# Patient Record
Sex: Female | Born: 1950 | Race: White | Hispanic: No | Marital: Married | Smoking: Former smoker
Health system: Southern US, Community
[De-identification: ages and names within clinical notes are randomized; demographics above are authoritative.]

## PROBLEM LIST (undated history)

## (undated) DIAGNOSIS — I10 Essential (primary) hypertension: Secondary | ICD-10-CM

## (undated) DIAGNOSIS — M797 Fibromyalgia: Secondary | ICD-10-CM

## (undated) DIAGNOSIS — F329 Major depressive disorder, single episode, unspecified: Secondary | ICD-10-CM

## (undated) DIAGNOSIS — M1990 Unspecified osteoarthritis, unspecified site: Secondary | ICD-10-CM

## (undated) DIAGNOSIS — F32A Depression, unspecified: Secondary | ICD-10-CM

## (undated) DIAGNOSIS — K529 Noninfective gastroenteritis and colitis, unspecified: Secondary | ICD-10-CM

## (undated) DIAGNOSIS — C801 Malignant (primary) neoplasm, unspecified: Secondary | ICD-10-CM

## (undated) HISTORY — DX: Noninfective gastroenteritis and colitis, unspecified: K52.9

## (undated) HISTORY — PX: WISDOM TOOTH EXTRACTION: SHX21

## (undated) HISTORY — PX: BREAST SURGERY: SHX581

## (undated) HISTORY — PX: TONSILLECTOMY: SUR1361

## (undated) HISTORY — PX: TYMPANOPLASTY: SHX33

## (undated) HISTORY — DX: Unspecified osteoarthritis, unspecified site: M19.90

## (undated) HISTORY — DX: Depression, unspecified: F32.A

## (undated) HISTORY — DX: Malignant (primary) neoplasm, unspecified: C80.1

## (undated) HISTORY — PX: APPENDECTOMY: SHX54

## (undated) HISTORY — DX: Fibromyalgia: M79.7

## (undated) HISTORY — DX: Major depressive disorder, single episode, unspecified: F32.9

## (undated) HISTORY — PX: MOHS SURGERY: SUR867

---

## 1983-02-24 HISTORY — PX: ABDOMINAL HYSTERECTOMY: SHX81

## 2003-12-20 ENCOUNTER — Ambulatory Visit: Payer: Self-pay

## 2004-01-23 ENCOUNTER — Ambulatory Visit: Payer: Self-pay

## 2005-11-02 ENCOUNTER — Ambulatory Visit: Payer: Self-pay | Admitting: Anesthesiology

## 2007-11-22 LAB — HM COLONOSCOPY: HM COLON: NORMAL

## 2007-12-11 ENCOUNTER — Emergency Department: Payer: Self-pay | Admitting: Emergency Medicine

## 2007-12-22 ENCOUNTER — Emergency Department: Payer: Self-pay | Admitting: Emergency Medicine

## 2008-01-05 ENCOUNTER — Ambulatory Visit: Payer: Self-pay

## 2008-10-05 ENCOUNTER — Ambulatory Visit: Payer: Self-pay | Admitting: Obstetrics and Gynecology

## 2009-02-23 DIAGNOSIS — C801 Malignant (primary) neoplasm, unspecified: Secondary | ICD-10-CM

## 2009-02-23 HISTORY — DX: Malignant (primary) neoplasm, unspecified: C80.1

## 2009-05-09 ENCOUNTER — Ambulatory Visit: Payer: Self-pay | Admitting: Women's Health

## 2009-05-28 ENCOUNTER — Ambulatory Visit: Payer: Self-pay | Admitting: Women's Health

## 2009-07-24 ENCOUNTER — Ambulatory Visit: Payer: Self-pay | Admitting: Gastroenterology

## 2009-10-08 ENCOUNTER — Ambulatory Visit: Payer: Self-pay

## 2009-10-10 ENCOUNTER — Ambulatory Visit: Payer: Self-pay | Admitting: Women's Health

## 2009-10-18 ENCOUNTER — Ambulatory Visit: Payer: Self-pay | Admitting: General Surgery

## 2009-10-22 ENCOUNTER — Ambulatory Visit: Payer: Self-pay | Admitting: General Surgery

## 2009-10-24 ENCOUNTER — Ambulatory Visit: Payer: Self-pay | Admitting: Internal Medicine

## 2009-11-04 LAB — PATHOLOGY REPORT

## 2009-11-07 ENCOUNTER — Ambulatory Visit: Payer: Self-pay | Admitting: Internal Medicine

## 2009-11-08 ENCOUNTER — Ambulatory Visit: Payer: Self-pay | Admitting: Internal Medicine

## 2009-11-08 LAB — CANCER ANTIGEN 27.29: CA 27.29: 12.2 U/mL (ref 0.0–38.6)

## 2009-11-19 ENCOUNTER — Ambulatory Visit: Payer: Self-pay | Admitting: General Surgery

## 2009-11-23 ENCOUNTER — Ambulatory Visit: Payer: Self-pay | Admitting: Internal Medicine

## 2009-12-24 ENCOUNTER — Ambulatory Visit: Payer: Self-pay | Admitting: Internal Medicine

## 2010-01-04 ENCOUNTER — Inpatient Hospital Stay: Payer: Self-pay | Admitting: Internal Medicine

## 2010-01-23 ENCOUNTER — Ambulatory Visit: Payer: Self-pay | Admitting: Internal Medicine

## 2010-02-23 ENCOUNTER — Ambulatory Visit: Payer: Self-pay | Admitting: Internal Medicine

## 2010-03-26 ENCOUNTER — Ambulatory Visit: Payer: Self-pay | Admitting: Internal Medicine

## 2010-04-24 ENCOUNTER — Ambulatory Visit: Payer: Self-pay | Admitting: Internal Medicine

## 2010-05-20 ENCOUNTER — Ambulatory Visit: Payer: Self-pay | Admitting: Vascular Surgery

## 2010-05-25 ENCOUNTER — Ambulatory Visit: Payer: Self-pay | Admitting: Internal Medicine

## 2010-06-24 ENCOUNTER — Ambulatory Visit: Payer: Self-pay | Admitting: Internal Medicine

## 2010-07-25 ENCOUNTER — Ambulatory Visit: Payer: Self-pay | Admitting: Internal Medicine

## 2010-08-07 ENCOUNTER — Emergency Department: Payer: Self-pay | Admitting: Emergency Medicine

## 2010-08-24 ENCOUNTER — Ambulatory Visit: Payer: Self-pay | Admitting: Internal Medicine

## 2010-09-24 ENCOUNTER — Ambulatory Visit: Payer: Self-pay | Admitting: Internal Medicine

## 2010-10-25 ENCOUNTER — Ambulatory Visit: Payer: Self-pay | Admitting: Internal Medicine

## 2010-12-03 ENCOUNTER — Ambulatory Visit: Payer: Self-pay | Admitting: Internal Medicine

## 2010-12-04 LAB — CANCER ANTIGEN 27.29: CA 27.29: 14.4 U/mL (ref 0.0–38.6)

## 2010-12-10 ENCOUNTER — Ambulatory Visit (INDEPENDENT_AMBULATORY_CARE_PROVIDER_SITE_OTHER): Payer: BC Managed Care – PPO | Admitting: Internal Medicine

## 2010-12-10 ENCOUNTER — Encounter: Payer: Self-pay | Admitting: Internal Medicine

## 2010-12-10 ENCOUNTER — Ambulatory Visit: Payer: Self-pay | Admitting: Internal Medicine

## 2010-12-10 VITALS — BP 154/90 | HR 87 | Temp 97.9°F | Resp 14 | Ht 65.5 in | Wt 158.0 lb

## 2010-12-10 DIAGNOSIS — IMO0001 Reserved for inherently not codable concepts without codable children: Secondary | ICD-10-CM

## 2010-12-10 DIAGNOSIS — R42 Dizziness and giddiness: Secondary | ICD-10-CM

## 2010-12-10 DIAGNOSIS — M797 Fibromyalgia: Secondary | ICD-10-CM | POA: Insufficient documentation

## 2010-12-10 DIAGNOSIS — Z853 Personal history of malignant neoplasm of breast: Secondary | ICD-10-CM | POA: Insufficient documentation

## 2010-12-10 DIAGNOSIS — R51 Headache: Secondary | ICD-10-CM

## 2010-12-10 LAB — VITAMIN B12: Vitamin B-12: 556 pg/mL (ref 211–911)

## 2010-12-10 MED ORDER — LIDOCAINE 5 % EX PTCH: 1.0000 | MEDICATED_PATCH | CUTANEOUS | Status: DC

## 2010-12-10 MED ORDER — DICLOFENAC SODIUM 1 % TD GEL: 1.0000 "application " | TRANSDERMAL | Status: DC | PRN

## 2010-12-10 NOTE — Patient Instructions (Signed)
CT scan of head today. We will call you with results.  Follow up in 1 week.

## 2010-12-10 NOTE — Progress Notes (Signed)
Subjective:    Patient ID: Andrea Golden, female    DOB: 09/24/1950, 60 y.o.   MRN: [id number]  HPI Ms. Roston is a 60 year old female with a history of breast cancer diagnosed last year and treated with chemotherapy and radiation. She presents to establish care. Her primary concern today is a 2 week history of progressively worsening severe headache. She describes this headache as the worst headache of her life it extends from the mid anterior for head back to the posterior scalp. It is described as constant but worsened with lying down. She also has associated dizziness, lightheadedness, and nausea. She has not had vomiting. She notes some numbness around her mouth which started today.She denies any weakness in her extremities. She does complain of fatigue. 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 She has tried taking Tylenol and nonsteroidals for headache pain with no improvement.   Outpatient Encounter Prescriptions as of 12/10/2010  Medication Sig Dispense Refill  . aspirin 81 MG tablet Take 81 mg by mouth daily.        Burton Apley Oil 1000 MG CAPS Take 1 capsule by mouth daily.        . Cholecalciferol (VITAMIN D3) 2000 UNITS TABS Take 1 tablet by mouth daily.        . diclofenac sodium (VOLTAREN) 1 % GEL Apply 1 application topically as needed.  100 g  3  . lidocaine (LIDODERM) 5 % Place 1 patch onto the skin daily. Remove & Discard patch within 12 hours or as directed by MD as needed  30 patch  3  . Multiple Vitamins-Minerals (MULTIVITAMIN WITH MINERALS) tablet Take 1 tablet by mouth daily.        . Probiotic Product (PROBIOTIC FORMULA PO) Take 1 tablet by mouth daily.        Marland Kitchen Resveratrol 50 MG CAPS Take 1 capsule by mouth daily.        Marland Kitchen DISCONTD: diclofenac sodium (VOLTAREN) 1 % GEL Apply 1 application topically as needed.        Marland Kitchen DISCONTD: lidocaine (LIDODERM) 5 % Place 1 patch onto the skin daily. Remove & Discard patch within 12 hours or as directed by MD as  needed          Review of Systems  Constitutional: Negative for fever, chills, appetite change, fatigue and unexpected weight change.  HENT: Negative for ear pain, congestion, sore throat, trouble swallowing, neck pain, voice change and sinus pressure.   Eyes: Negative for visual disturbance.  Respiratory: Negative for cough, shortness of breath, wheezing and stridor.   Cardiovascular: Negative for chest pain, palpitations and leg swelling.  Gastrointestinal: Positive for nausea. Negative for vomiting, abdominal pain, diarrhea, constipation, blood in stool, abdominal distention and anal bleeding.  Genitourinary: Negative for dysuria and flank pain.  Musculoskeletal: Negative for myalgias, arthralgias and gait problem.  Skin: Negative for color change and rash.  Neurological: Positive for dizziness, light-headedness, numbness (perioral) and headaches. Negative for tremors, syncope, speech difficulty and weakness.  Hematological: Negative for adenopathy. Does not bruise/bleed easily.  Psychiatric/Behavioral: Negative for suicidal ideas, sleep disturbance and dysphoric mood. The patient is not nervous/anxious.    BP 154/90  Pulse 87  Temp(Src) 97.9 F (36.6 C) (Oral)  Resp 14  Ht 5' 5.5" (1.664 m)  Wt 158 lb (71.668 kg)  BMI 25.89 kg/m2  SpO2 97%     Objective:   Physical Exam  Constitutional: She is oriented to person, place, and time. She appears well-developed and  well-nourished. No distress.  HENT:  Head: Normocephalic and atraumatic.  Right Ear: External ear normal.  Left Ear: External ear normal.  Nose: Nose normal.  Mouth/Throat: Oropharynx is clear and moist. No oropharyngeal exudate.  Eyes: Conjunctivae and EOM are normal. Pupils are equal, round, and reactive to light. Right eye exhibits no discharge. Left eye exhibits no discharge. No scleral icterus.  Fundoscopic exam:      The right eye shows venous pulsations.      The left eye shows venous pulsations. Neck:  Normal range of motion. Neck supple. No tracheal deviation present. No thyromegaly present.  Cardiovascular: Normal rate, regular rhythm, normal heart sounds and intact distal pulses.  Exam reveals no gallop and no friction rub.   No murmur heard. Pulmonary/Chest: Effort normal and breath sounds normal. No respiratory distress. She has no wheezes. She has no rales. She exhibits no tenderness.  Musculoskeletal: Normal range of motion. She exhibits no edema and no tenderness.  Lymphadenopathy:    She has no cervical adenopathy.  Neurological: She is alert and oriented to person, place, and time. No cranial nerve deficit or sensory deficit. She exhibits normal muscle tone. Coordination and gait normal.  Skin: Skin is warm and dry. No rash noted. She is not diaphoretic. No erythema. No pallor.  Psychiatric: She has a normal mood and affect. Her behavior is normal. Judgment and thought content normal.          Assessment & Plan:  1. Headache and dizziness - patient presents with headache which she describes as the worst headache of her life. She is also noted to have some nausea and perioral numbness. Given her recent history of breast cancer I have some concern of metastatic disease. Also, given severity of symptoms, I am concerned about intracranial bleeding. Stroke is also concern given perioral numbness. Will get stat head CT without contrast. If this is negative for acute process will set up MRI brain. Will also check TSH and Vit B12. We have discussed the use of some pain medications such as Percocet or Vicodin, but patient would prefer to defer at this time. I've cautioned her against using any nonsteroidals because of the potential risk of bleeding.  2. Fibromyalgia -patient reports history of fibromyalgia. Will refill Lidoderm patch and voltaren gel today. Will obtain records from her previous physician.

## 2010-12-12 ENCOUNTER — Other Ambulatory Visit: Payer: Self-pay | Admitting: Internal Medicine

## 2010-12-12 ENCOUNTER — Ambulatory Visit: Payer: Self-pay | Admitting: Internal Medicine

## 2010-12-12 ENCOUNTER — Emergency Department: Payer: Self-pay | Admitting: Emergency Medicine

## 2010-12-12 DIAGNOSIS — R51 Headache: Secondary | ICD-10-CM

## 2010-12-13 ENCOUNTER — Emergency Department: Payer: Self-pay | Admitting: Unknown Physician Specialty

## 2010-12-16 ENCOUNTER — Telehealth: Payer: Self-pay | Admitting: *Deleted

## 2010-12-16 NOTE — Telephone Encounter (Signed)
Message copied by Jobie Quaker on Tue Dec 16, 2010  4:40 PM ------      Message from: Ronna Polio A      Created: Fri Dec 12, 2010  5:41 PM      Regarding: MRI Brain       MRI brain was normal. Has headache improved?

## 2010-12-17 ENCOUNTER — Encounter: Payer: Self-pay | Admitting: Internal Medicine

## 2010-12-17 ENCOUNTER — Ambulatory Visit (INDEPENDENT_AMBULATORY_CARE_PROVIDER_SITE_OTHER): Payer: BC Managed Care – PPO | Admitting: Internal Medicine

## 2010-12-17 DIAGNOSIS — G43909 Migraine, unspecified, not intractable, without status migrainosus: Secondary | ICD-10-CM

## 2010-12-17 DIAGNOSIS — Z23 Encounter for immunization: Secondary | ICD-10-CM

## 2010-12-17 MED ORDER — PROPRANOLOL HCL 60 MG PO CP24: 60.0000 mg | ORAL_CAPSULE | Freq: Every day | ORAL | Status: DC

## 2010-12-17 NOTE — Progress Notes (Signed)
Subjective:    Patient ID: Andrea Golden, female    DOB: [birth year], 60 y.o.   MRN: [id number]  HPI 60 year old female presents to followup headache. After her last visit she underwent CT head which was normal. She then underwent MRI brain which was also normal. She reports that her headache has persisted. It was particularly bad yesterday during which time she reports seeing a gold color circled aura. She reports that during this time she went to her room and turn the lights out and her headache improved with the absence of light and noise. Her headache continues to occur on a daily basis. It is associated with some malaise. It keeps her from sleeping. She tried using Ambien with no improvement. She has not taken any pain medications for her headache. She denies any fever or chills. She denies any nausea currently.  Outpatient Encounter Prescriptions as of 12/17/2010  Medication Sig Dispense Refill  . aspirin 81 MG tablet Take 81 mg by mouth daily.        Burton Apley Oil 1000 MG CAPS Take 1 capsule by mouth daily.        . Cholecalciferol (VITAMIN D3) 2000 UNITS TABS Take 1 tablet by mouth daily.        . diclofenac sodium (VOLTAREN) 1 % GEL Apply 1 application topically as needed.  100 g  3  . EPIPEN 2-PAK 0.3 MG/0.3ML DEVI Inject 0.3 mLs into the muscle as needed.      . famotidine (PEPCID) 20 MG tablet Take 1 tablet by mouth Twice daily.      Marland Kitchen lidocaine (LIDODERM) 5 % Place 1 patch onto the skin daily. Remove & Discard patch within 12 hours or as directed by MD as needed  30 patch  3  . Multiple Vitamins-Minerals (MULTIVITAMIN WITH MINERALS) tablet Take 1 tablet by mouth daily.        . Probiotic Product (PROBIOTIC FORMULA PO) Take 1 tablet by mouth daily.        Marland Kitchen Resveratrol 50 MG CAPS Take 1 capsule by mouth daily.        . propranolol (INDERAL LA) 60 MG 24 hr capsule Take 1 capsule (60 mg total) by mouth daily.  30 capsule  3    Review of Systems  Constitutional: Negative for fever,  chills, diaphoresis and fatigue.  HENT: Negative for neck pain.   Respiratory: Negative for chest tightness and shortness of breath.   Cardiovascular: Negative for chest pain.  Neurological: Positive for headaches. Negative for syncope, speech difficulty and weakness.   BP 147/72  Pulse 78  Temp(Src) 98.4 F (36.9 C) (Oral)  Wt 150 lb (68.04 kg)  SpO2 96%     Objective:   Physical Exam  Constitutional: She is oriented to person, place, and time. She appears well-developed and well-nourished. No distress.  HENT:  Head: Normocephalic and atraumatic.  Right Ear: External ear normal.  Left Ear: External ear normal.  Nose: Nose normal.  Mouth/Throat: Oropharynx is clear and moist. No oropharyngeal exudate.  Eyes: Conjunctivae are normal. Pupils are equal, round, and reactive to light. Right eye exhibits no discharge. Left eye exhibits no discharge. No scleral icterus.  Neck: Normal range of motion. Neck supple. No tracheal deviation present. No thyromegaly present.  Cardiovascular: Normal rate, regular rhythm, normal heart sounds and intact distal pulses.  Exam reveals no gallop and no friction rub.   No murmur heard. Pulmonary/Chest: Effort normal and breath sounds normal. No respiratory distress. She has no  wheezes. She has no rales. She exhibits no tenderness.  Musculoskeletal: Normal range of motion. She exhibits no edema and no tenderness.  Lymphadenopathy:    She has no cervical adenopathy.  Neurological: She is alert and oriented to person, place, and time. No cranial nerve deficit. She exhibits normal muscle tone. Coordination normal.  Skin: Skin is warm and dry. No rash noted. She is not diaphoretic. No erythema. No pallor.  Psychiatric: She has a normal mood and affect. Her behavior is normal. Judgment and thought content normal.          Assessment & Plan:  1. Migraine Headache - at this point, headaches seem most consistent with migraines. Given that they are occurring  almost daily we discussed the use of a preventative medicine such as propanolol. Plan to start propanolol daily. We will also add rescue medication. She was given samples of Zomig today. She was instructed to start with half of a 2.5 mg tablet to see if any improvement in headache. If no improvement she will take a full 2.5 mg dose. She may repeat this dose twice in a 24-hour period. She will return to clinic in 2 weeks. She will call sooner if symptoms are not improving. If no improvement we discussed use of other medications such as amitriptyline. She is not able to tolerate Topamax and is allergic to this medicine. We also discussed referral to the headache clinic.

## 2010-12-17 NOTE — Patient Instructions (Addendum)
Start Propranolol (Inderal) 60mg  daily.  Use Zomig as needed up to twice in one day for severe headache. Follow up in 2 weeks.  Migraine Headache A migraine headache is an intense, throbbing pain on one or both sides of your head. The exact cause of a migraine headache is not always known. A migraine may be caused when nerves in the brain become irritated and release chemicals that cause swelling within blood vessels, causing pain. Many migraine sufferers have a family history of migraines. Before you get a migraine you may or may not get an aura. An aura is a group of symptoms that can predict the beginning of a migraine. An aura may include:  Visual changes such as:   Flashing lights.   Bright spots or zig-zag lines.   Tunnel vision.   Feelings of numbness.   Trouble talking.   Muscle weakness.  SYMPTOMS  Pain on one or both sides of your head.   Pain that is pulsating or throbbing in nature.   Pain that is severe enough to prevent daily activities.   Pain that is aggravated by any daily physical activity.   Nausea (feeling sick to your stomach), vomiting, or both.   Pain with exposure to bright lights, loud noises, or activity.   General sensitivity to bright lights or loud noises.  MIGRAINE TRIGGERS Examples of triggers of migraine headaches include:   Alcohol.   Smoking.   Stress.   It may be related to menses (female menstruation).   Aged cheeses.   Foods or drinks that contain nitrates, glutamate, aspartame, or tyramine.   Lack of sleep.   Chocolate.   Caffeine.   Hunger.   Medications such as nitroglycerine (used to treat chest pain), birth control pills, estrogen, and some blood pressure medications.  DIAGNOSIS  A migraine headache is often diagnosed based on:  Symptoms.   Physical examination.   A computerized X-ray scan (computed tomography, CT) of your head.  TREATMENT  Medications can help prevent migraines if they are recurrent or  should they become recurrent. Your caregiver can help you with a medication or treatment program that will be helpful to you.   Lying down in a dark, quiet room may be helpful.   Keeping a headache diary may help you find a trend as to what may be triggering your headaches.  SEEK IMMEDIATE MEDICAL CARE IF:   You have confusion, personality changes or seizures.   You have headaches that wake you from sleep.   You have an increased frequency in your headaches.   You have a stiff neck.   You have a loss of vision.   You have muscle weakness.   You start losing your balance or have trouble walking.   You feel faint or pass out.  MAKE SURE YOU:   Understand these instructions.   Will watch your condition.   Will get help right away if you are not doing well or get worse.  Document Released: 02/09/2005 Document Revised: 10/22/2010 Document Reviewed: 09/25/2008 [facility] Patient Information 2012 [place], [place].

## 2010-12-17 NOTE — Telephone Encounter (Signed)
Patient notified. She says that her headache has not improved. She says that she will discuss it with you at her appt today.

## 2010-12-25 ENCOUNTER — Ambulatory Visit: Payer: Self-pay | Admitting: Internal Medicine

## 2010-12-31 ENCOUNTER — Encounter: Payer: Self-pay | Admitting: Internal Medicine

## 2010-12-31 ENCOUNTER — Telehealth: Payer: Self-pay | Admitting: Internal Medicine

## 2010-12-31 ENCOUNTER — Ambulatory Visit: Payer: BC Managed Care – PPO | Admitting: Internal Medicine

## 2010-12-31 NOTE — Telephone Encounter (Signed)
Indigestion should not be a side effect from Propranolol.  Is the medication helping her headaches?  If not, we can stop it.

## 2010-12-31 NOTE — Telephone Encounter (Signed)
Left detailed VM on pt's hm # to call office back w/update

## 2010-12-31 NOTE — Telephone Encounter (Signed)
[phone] Pt had been taking famotidine 20 mg 1 tablet by mouth twice a day for 10 days.  Pt notice after stopping meds she had indigestion really bad she is also taking propranolol er 60 mg she wanted to know if this is one of the side effects.

## 2011-01-06 ENCOUNTER — Encounter: Payer: Self-pay | Admitting: Internal Medicine

## 2011-01-06 ENCOUNTER — Ambulatory Visit (INDEPENDENT_AMBULATORY_CARE_PROVIDER_SITE_OTHER): Payer: BC Managed Care – PPO | Admitting: Internal Medicine

## 2011-01-06 DIAGNOSIS — R221 Localized swelling, mass and lump, neck: Secondary | ICD-10-CM

## 2011-01-06 DIAGNOSIS — G43909 Migraine, unspecified, not intractable, without status migrainosus: Secondary | ICD-10-CM

## 2011-01-06 DIAGNOSIS — M62830 Muscle spasm of back: Secondary | ICD-10-CM

## 2011-01-06 DIAGNOSIS — M5380 Other specified dorsopathies, site unspecified: Secondary | ICD-10-CM

## 2011-01-06 DIAGNOSIS — R220 Localized swelling, mass and lump, head: Secondary | ICD-10-CM

## 2011-01-06 MED ORDER — CYCLOBENZAPRINE HCL 5 MG PO TABS: ORAL_TABLET | ORAL | Status: DC

## 2011-01-06 NOTE — Progress Notes (Signed)
Subjective:    Patient ID: Andrea Golden, female    DOB: Sep 25, 1950, 60 y.o.   MRN: [id number]  HPI 60YO female with h/o breast cancer and recent migraine headaches presents for follow up.  Reports that headaches are improved with propranolol, but having fatigue. Notes, however, that sleeping poorly, only 3 hr per night on average.  Has used rescue Zomig only once in past month.  Today, is primarily concerned about bilateral swelling in her neck and posterior neck pain.  Notes that swelling has been present since her infusaport was placed, however recently seems to be worsening.  Pain in posterior neck present for 1-2 weeks. Associated with neck stiffness. Has not used pain medication for this.  No fever, focal neurologic symptoms.   Outpatient Encounter Prescriptions as of 01/06/2011  Medication Sig Dispense Refill  . aspirin 81 MG tablet Take 81 mg by mouth daily.        Burton Apley Oil 1000 MG CAPS Take 1 capsule by mouth daily.        . Cholecalciferol (VITAMIN D3) 2000 UNITS TABS Take 1 tablet by mouth daily.        . diclofenac sodium (VOLTAREN) 1 % GEL Apply 1 application topically as needed.  100 g  3  . EPIPEN 2-PAK 0.3 MG/0.3ML DEVI Inject 0.3 mLs into the muscle as needed.      . lidocaine (LIDODERM) 5 % Place 1 patch onto the skin daily. Remove & Discard patch within 12 hours or as directed by MD as needed  30 patch  3  . Multiple Vitamins-Minerals (MULTIVITAMIN WITH MINERALS) tablet Take 1 tablet by mouth daily.        . Probiotic Product (PROBIOTIC FORMULA PO) Take 1 tablet by mouth daily.        . propranolol (INDERAL LA) 60 MG 24 hr capsule Take 1 capsule (60 mg total) by mouth daily.  30 capsule  3  . Resveratrol 50 MG CAPS Take 1 capsule by mouth daily.        Marland Kitchen ZOLMitriptan (ZOMIG) 2.5 MG tablet Take 2.5 mg by mouth as needed.        . cyclobenzaprine (FLEXERIL) 5 MG tablet Take 1-2 tablets as needed for back spasm  90 tablet  1    Review of Systems  Constitutional: Positive  for fatigue. Negative for fever, chills, appetite change and unexpected weight change.  HENT: Positive for facial swelling, neck pain and neck stiffness. Negative for ear pain, congestion, sore throat, trouble swallowing, voice change and sinus pressure.   Eyes: Negative for visual disturbance.  Respiratory: Negative for cough, shortness of breath, wheezing and stridor.   Cardiovascular: Negative for chest pain, palpitations and leg swelling.  Genitourinary: Negative for dysuria and flank pain.  Musculoskeletal: Negative for myalgias, arthralgias and gait problem.  Skin: Negative for color change and rash.  Neurological: Positive for headaches. Negative for dizziness.  Hematological: Negative for adenopathy. Does not bruise/bleed easily.  Psychiatric/Behavioral: Positive for sleep disturbance. Negative for suicidal ideas and dysphoric mood. The patient is not nervous/anxious.    BP 110/60  Pulse 67  Temp(Src) 98.3 F (36.8 C) (Oral)  Wt 152 lb (68.947 kg)  SpO2 95%     Objective:   Physical Exam  Constitutional: She is oriented to person, place, and time. She appears well-developed and well-nourished. No distress.  HENT:  Head: Normocephalic and atraumatic.  Right Ear: External ear normal.  Left Ear: External ear normal.  Nose: Nose normal.  Mouth/Throat: Oropharynx is clear and moist. No oropharyngeal exudate.  Eyes: Conjunctivae are normal. Pupils are equal, round, and reactive to light. Right eye exhibits no discharge. Left eye exhibits no discharge. No scleral icterus.  Neck: Normal range of motion. Neck supple. Normal carotid pulses present. Muscular tenderness present. No tracheal tenderness present. Carotid bruit is not present. Edema present. No tracheal deviation and no erythema present. No mass and no thyromegaly present.  Cardiovascular: Normal rate, regular rhythm, normal heart sounds and intact distal pulses.  Exam reveals no gallop and no friction rub.   No murmur  heard. Pulmonary/Chest: Effort normal and breath sounds normal. No stridor. No respiratory distress. She has no wheezes. She has no rales. She exhibits no tenderness.    Musculoskeletal: Normal range of motion. She exhibits no edema and no tenderness.       Cervical back: She exhibits tenderness, pain and spasm. She exhibits normal range of motion.  Lymphadenopathy:    She has no cervical adenopathy.  Neurological: She is alert and oriented to person, place, and time. No cranial nerve deficit. She exhibits normal muscle tone. Coordination normal.  Skin: Skin is warm and dry. No rash noted. She is not diaphoretic. No erythema. No pallor.  Psychiatric: She has a normal mood and affect. Her behavior is normal. Judgment and thought content normal.          Assessment & Plan:  1. Neck swelling - Pt with h/o breast cancer, s/p port placement and removal. Question vascular damage and SVC syndrome. Will get chest CT with contrast for further evaluation.   2. Muscular spasm trapezius - Will add flexeril to see if any improvement. Follow up 2 weeks.  3. Headache - Improved with propranolol. Has only used rescue Zomig on 1 occasion.  Fatigue with propranolol, however question if this may be related to insomnia. Will see if sleeping improves with treatment of upper back pain. Will continue propranolol for now.

## 2011-01-06 NOTE — Patient Instructions (Signed)
Question superior vena cava syndrome.  Will order CT of chest for evaluation.

## 2011-01-12 ENCOUNTER — Ambulatory Visit: Payer: Self-pay | Admitting: Internal Medicine

## 2011-01-13 ENCOUNTER — Telehealth: Payer: Self-pay | Admitting: Internal Medicine

## 2011-01-13 DIAGNOSIS — I82B11 Acute embolism and thrombosis of right subclavian vein: Secondary | ICD-10-CM

## 2011-01-13 NOTE — Telephone Encounter (Signed)
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 This may be from damage from her port placement, ie scarring. I would like for her to be seen by vascular surgery to get an ultrasound of this area.  I would recommend Dr. Wyn Quaker or Dr. Lorretta Harp from [facility]. There were also some nodular areas seen in the lungs which may represent old scarring or infection. We should repeat a CT chest in 06/2011.

## 2011-01-13 NOTE — Telephone Encounter (Signed)
Patient informed. Referral placed.

## 2011-01-20 ENCOUNTER — Ambulatory Visit (INDEPENDENT_AMBULATORY_CARE_PROVIDER_SITE_OTHER): Payer: BC Managed Care – PPO | Admitting: Internal Medicine

## 2011-01-20 ENCOUNTER — Encounter: Payer: Self-pay | Admitting: Internal Medicine

## 2011-01-20 VITALS — BP 150/72 | HR 72 | Temp 98.3°F | Wt 155.0 lb

## 2011-01-20 DIAGNOSIS — R220 Localized swelling, mass and lump, head: Secondary | ICD-10-CM

## 2011-01-20 DIAGNOSIS — R221 Localized swelling, mass and lump, neck: Secondary | ICD-10-CM

## 2011-01-20 DIAGNOSIS — M549 Dorsalgia, unspecified: Secondary | ICD-10-CM

## 2011-01-20 NOTE — Progress Notes (Signed)
Subjective:    Patient ID: Andrea Golden, female    DOB: 1950-07-11, 60 y.o.   MRN: [id number]  HPI  60YO female presents to follow up neck swelling and upper back pain. After last visit, had CT chest which showed possible occlusion in right SCV. She reports that neck swelling has improved without intervention.  She notes that upper back pain has improved with use of flexeril.  She continues to note generalized malaise, fatigue, and intermittent lightheadedness and difficulty concentrating. She attributes this to chemotherapy.   Outpatient Encounter Prescriptions as of 01/20/2011  Medication Sig Dispense Refill  . aspirin 81 MG tablet Take 81 mg by mouth daily.        Burton Apley Oil 1000 MG CAPS Take 1 capsule by mouth daily.        . Cholecalciferol (VITAMIN D3) 2000 UNITS TABS Take 1 tablet by mouth daily.        . cyclobenzaprine (FLEXERIL) 5 MG tablet Take 1-2 tablets as needed for back spasm  90 tablet  1  . diclofenac sodium (VOLTAREN) 1 % GEL Apply 1 application topically as needed.  100 g  3  . EPIPEN 2-PAK 0.3 MG/0.3ML DEVI Inject 0.3 mLs into the muscle as needed.      . lidocaine (LIDODERM) 5 % Place 1 patch onto the skin daily. Remove & Discard patch within 12 hours or as directed by MD as needed  30 patch  3  . Multiple Vitamins-Minerals (MULTIVITAMIN WITH MINERALS) tablet Take 1 tablet by mouth daily.        . Probiotic Product (PROBIOTIC FORMULA PO) Take 1 tablet by mouth daily.        . propranolol (INDERAL LA) 60 MG 24 hr capsule Take 1 capsule (60 mg total) by mouth daily.  30 capsule  3  . Resveratrol 50 MG CAPS Take 1 capsule by mouth daily.        Marland Kitchen ZOLMitriptan (ZOMIG) 2.5 MG tablet Take 2.5 mg by mouth as needed.           Review of Systems  Constitutional: Positive for fatigue. Negative for fever and chills.  HENT: Positive for neck pain and neck stiffness. Negative for congestion.   Respiratory: Negative for cough and shortness of breath.   Cardiovascular:  Negative for chest pain, palpitations and leg swelling.  Gastrointestinal: Negative for abdominal pain.  Musculoskeletal: Positive for myalgias, back pain and arthralgias.  Neurological: Positive for weakness and light-headedness.   BP 150/72  Pulse 72  Temp(Src) 98.3 F (36.8 C) (Oral)  Wt 155 lb (70.308 kg)  SpO2 94%     Objective:   Physical Exam  Constitutional: She is oriented to person, place, and time. She appears well-developed and well-nourished. No distress.  HENT:  Head: Normocephalic and atraumatic.  Right Ear: External ear normal.  Left Ear: External ear normal.  Nose: Nose normal.  Mouth/Throat: Oropharynx is clear and moist. No oropharyngeal exudate.  Eyes: Conjunctivae are normal. Pupils are equal, round, and reactive to light. Right eye exhibits no discharge. Left eye exhibits no discharge. No scleral icterus.  Neck: Normal range of motion. Neck supple. No tracheal deviation present. No thyromegaly present.  Cardiovascular: Normal rate, regular rhythm, normal heart sounds and intact distal pulses.  Exam reveals no gallop and no friction rub.   No murmur heard. Pulmonary/Chest: Effort normal and breath sounds normal. No respiratory distress. She has no wheezes. She has no rales. She exhibits no tenderness.  Musculoskeletal: Normal range  of motion. She exhibits no edema and no tenderness.  Lymphadenopathy:    She has no cervical adenopathy.  Neurological: She is alert and oriented to person, place, and time. No cranial nerve deficit. She exhibits normal muscle tone. Coordination normal.  Skin: Skin is warm and dry. No rash noted. She is not diaphoretic. No erythema. No pallor.  Psychiatric: She has a normal mood and affect. Her behavior is normal. Judgment and thought content normal.          Assessment & Plan:  1. 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 Suspect this may be scarring from previous port. Question if this may  contribute to intermittent neck and facial swelling. We have set pt up with vascular surgery for evaluation.   2. Muscular spasm trapezius - Slight improvement on flexeril. Will continue.  3. General malaise - Since having chemotherapy. Labwork unrevealing. We discussed possible referral to the [facility] at [facility]. Pt will think about this.

## 2011-01-27 ENCOUNTER — Ambulatory Visit: Payer: Self-pay | Admitting: Vascular Surgery

## 2011-01-29 ENCOUNTER — Encounter: Payer: Self-pay | Admitting: Internal Medicine

## 2011-02-01 IMAGING — PT NM PET TUM IMG RESTAG (PS) SKULL BASE T - THIGH
5 series · 23 of 25 positions shown · non-contrast
Comparison: none

REASON FOR EXAM: TRIPLE NEGATIVE BREAST CA
COMMENTS:

PROCEDURE:     PET - PET/CT RESTG BREAST CA  - November 08, 2009 [DATE]
RESULT:     Indication: Triple negative breast cancer
Radiopharmaceutical: 12.97 mCi F18-FDG, intravenously.
TECHNIQUE: Imaging was performed from the skull base to the mid-thigh using
routine PET/CT acquisition protocol.
Injection site: Right antecubital
Time of FDG injection: 1987 hours
Serum glucose: 90 mg/dL
Time of imaging: 4003 hours
Comparison studies: None

[Series 3: ct wb 3.0 b30f · axial · 3.0mm · 0.98mm/px · z∈[-444,+424]mm · 10 of 435 slices shown]
[im 1/435]
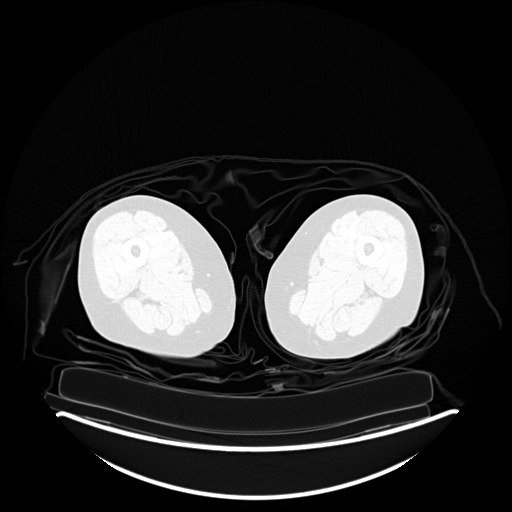
[im 44/435]
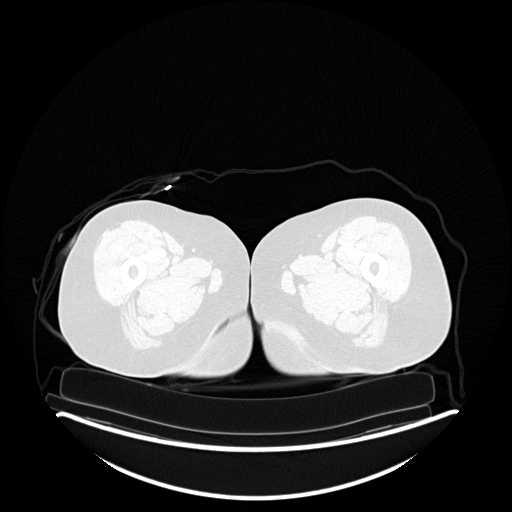
[im 87/435]
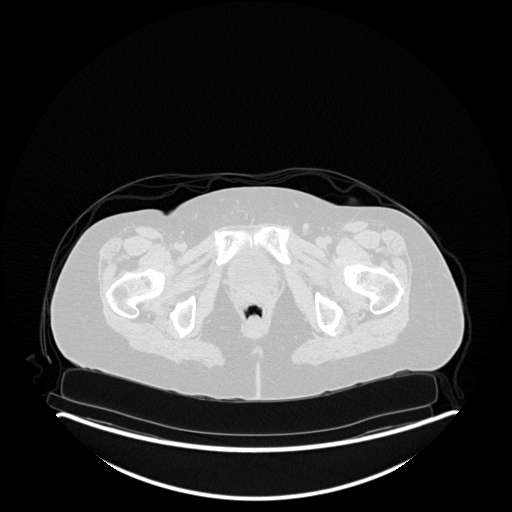
[im 131/435]
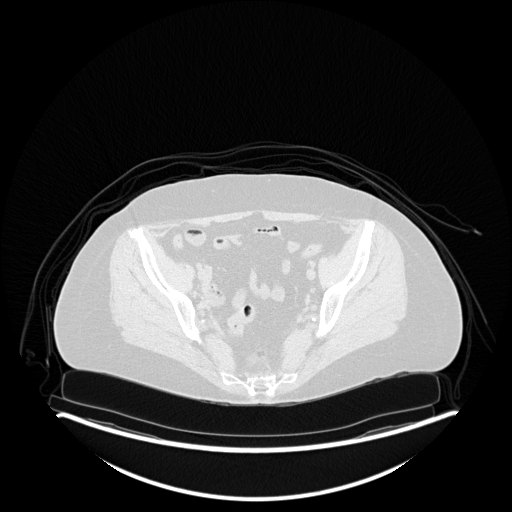
[im 174/435]
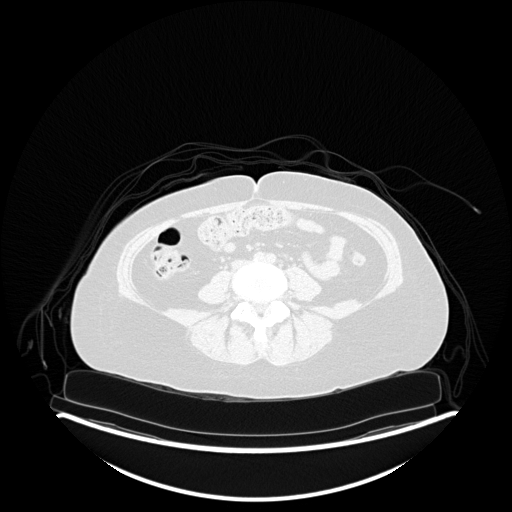
[im 218/435]
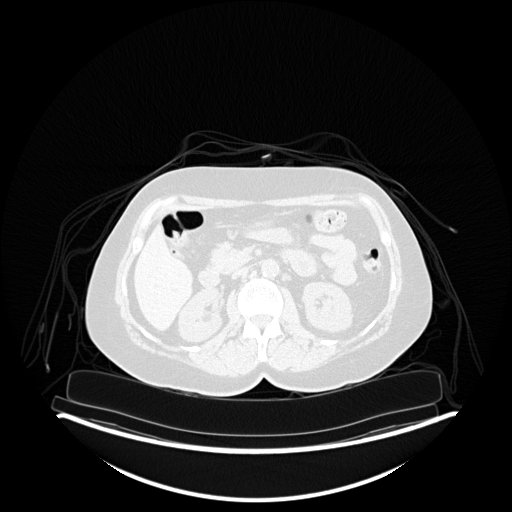
[im 304/435]
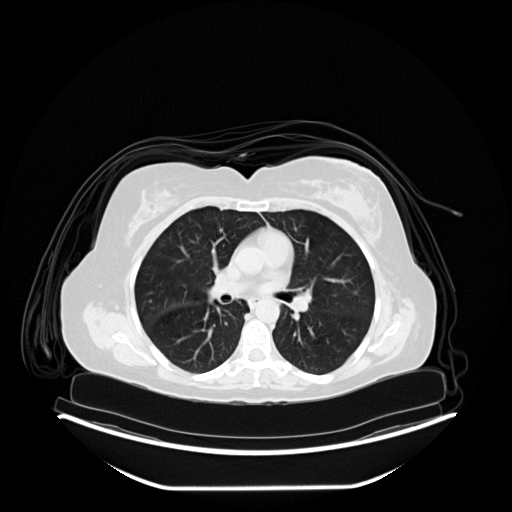
[im 348/435]
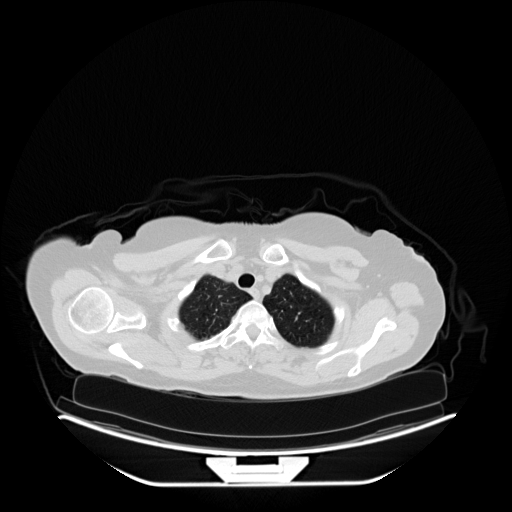
[im 391/435]
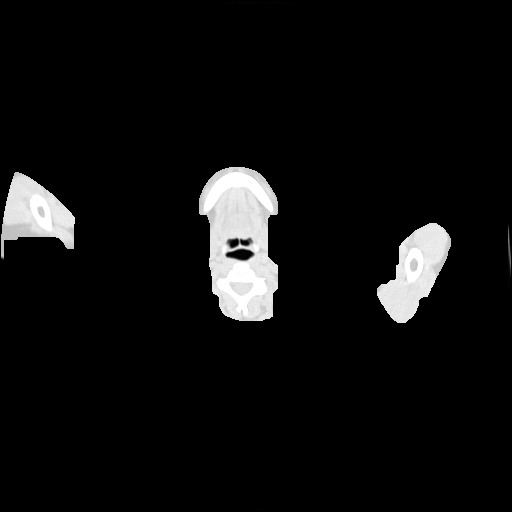
[im 435/435  brain]
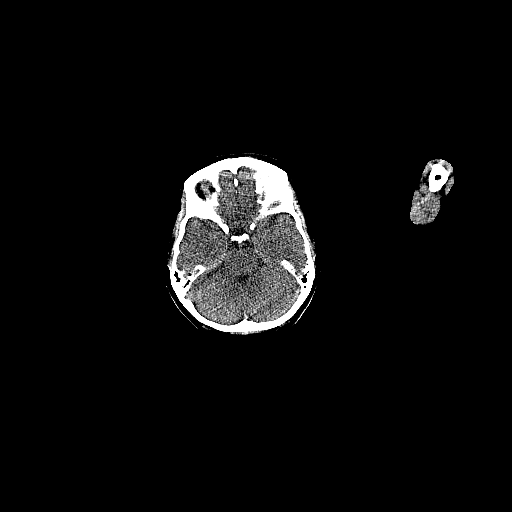

[Series 102: pet wb · axial · 5.0mm · 4.07mm/px · z∈[-444,+424]mm · 7 of 290 slices shown]
[im 1/290]
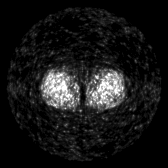
[im 49/290]
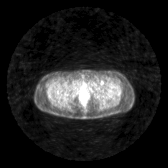
[im 97/290]
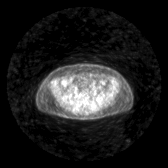
[im 145/290]
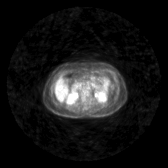
[im 193/290]
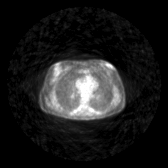
[im 241/290]
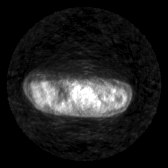
[im 290/290]
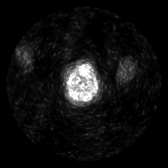

[Series 603: pet axial · 3 of 168 slices shown]
[im 56/168]
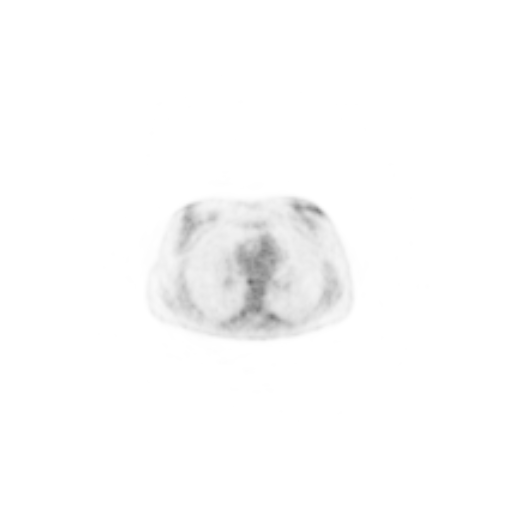
[im 112/168]
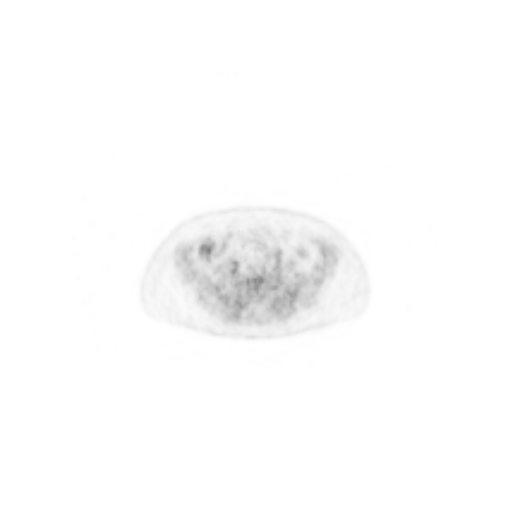
[im 168/168]
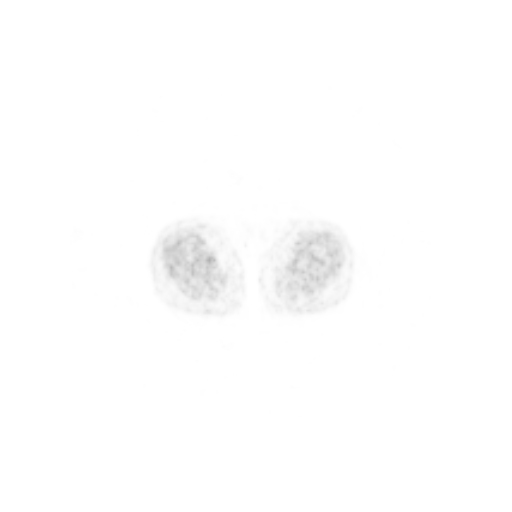

[Series 604: pet coronal · 1 of 52 slices shown]
[im 1/52]
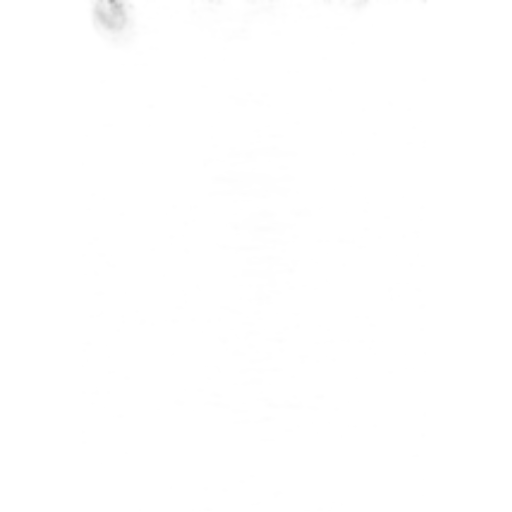

[Series 605: pet sagittal · 2 of 79 slices shown]
[im 1/79]
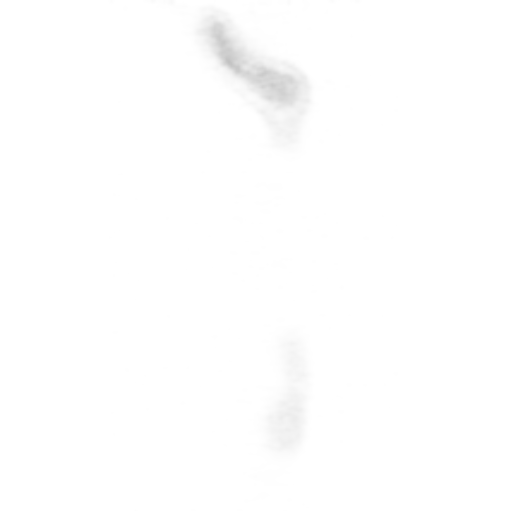
[im 79/79]
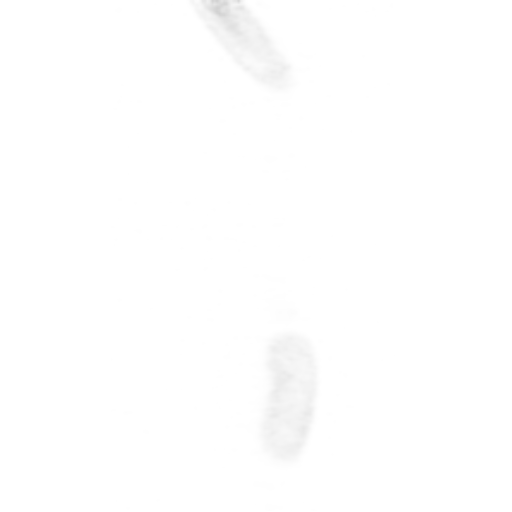

[23 of 25 positions shown; findings below may reference images not displayed]

FINDINGS: HEAD AND NECK:

There is no abnormal hypermetabolic activity in the head and neck. There is
no cervical soft tissue mass or lymphadenopathy.

CHEST:

There is faint and hypermetabolic activity within the left breast and left
axilla which likely reflect postsurgical changes , but underlying malignancy
cannot be excluded. There is a more focal left lateral breast nodule
measuring 1.4 cm with an SUV max of 2.6 and an SUV average of 2 .

There are bilateral emphysematous changes. The lungs are clear. There is no
focal parenchymal opacity, pleural effusion, or pneumothorax. The heart size
is normal. There is no pericardial effusion.

There no pathologically enlarged mediastinal, hilar, or axillary lymph
nodes.

The osseous structures demonstrate no focal abnormality.

ABDOMEN/PELVIS:

The liver demonstrates no focal abnormality. The gallbladder is
unremarkable. The spleen demonstrates no focal abnormality. The kidneys,
adrenal glands, pancreas are normal. The bladder is unremarkable.

The unopacified bowel is unremarkable. There is no pneumoperitoneum,
pneumatosis, or portal venous gas. There is no abdominal or pelvic free
fluid. There is no lymphadenopathy.

The abdominal aorta is normal in caliber.

The osseous structures are straight no focal abnormality.
IMPRESSION: There is faint and hypermetabolic activity within the left breast and left
axilla which likely reflect postsurgical changes , but underlying malignancy
cannot be excluded. There is a more focal left lateral breast nodule
measuring 1.4 cm with an SUV max of 2.6 and an SUV average of 2 .

## 2011-02-20 ENCOUNTER — Encounter: Payer: Self-pay | Admitting: Internal Medicine

## 2011-02-20 ENCOUNTER — Ambulatory Visit (INDEPENDENT_AMBULATORY_CARE_PROVIDER_SITE_OTHER): Payer: BC Managed Care – PPO | Admitting: Internal Medicine

## 2011-02-20 VITALS — BP 130/78 | HR 86 | Temp 98.0°F | Wt 155.0 lb

## 2011-02-20 DIAGNOSIS — R220 Localized swelling, mass and lump, head: Secondary | ICD-10-CM | POA: Insufficient documentation

## 2011-02-20 DIAGNOSIS — M542 Cervicalgia: Secondary | ICD-10-CM | POA: Insufficient documentation

## 2011-02-20 DIAGNOSIS — R519 Headache, unspecified: Secondary | ICD-10-CM | POA: Insufficient documentation

## 2011-02-20 DIAGNOSIS — R51 Headache: Secondary | ICD-10-CM | POA: Insufficient documentation

## 2011-02-20 NOTE — Progress Notes (Signed)
Subjective:    Patient ID: Andrea Golden, female    DOB: August 28, 1950, 60 y.o.   MRN: [id number]  HPI 60YO female with h/o breast cancer s/p XRT and chemotherapy presents for follow up.  Over the last several months, she had noted some swelling in her neck and face. This was evaluated with CT scan which showed possible occlusion of right subclavian vein. She reports that she was recently seen by vascular and had imaging of right subclavian which confirmed no clot. She continues to have intermittent swelling of her neck and face.  She does not have swelling in her arms.  She notes that headaches are unchanged. She continues to have HA 1-2 times per week. She has been using Zomig prn with relief of her headache.  She notes that she has been using Flexeril at night for muscle spasm in her neck.  She reports that her pain and muscle spasm is improved with flexeril.  Outpatient Encounter Prescriptions as of 02/20/2011  Medication Sig Dispense Refill  . aspirin 81 MG tablet Take 81 mg by mouth daily.        Burton Apley Oil 1000 MG CAPS Take 1 capsule by mouth daily.        . Cholecalciferol (VITAMIN D3) 2000 UNITS TABS Take 1 tablet by mouth daily.        . cyclobenzaprine (FLEXERIL) 5 MG tablet Take 1-2 tablets as needed for back spasm  90 tablet  1  . diclofenac sodium (VOLTAREN) 1 % GEL Apply 1 application topically as needed.  100 g  3  . EPIPEN 2-PAK 0.3 MG/0.3ML DEVI Inject 0.3 mLs into the muscle as needed.      . lidocaine (LIDODERM) 5 % Place 1 patch onto the skin daily. Remove & Discard patch within 12 hours or as directed by MD as needed  30 patch  3  . Multiple Vitamins-Minerals (MULTIVITAMIN WITH MINERALS) tablet Take 1 tablet by mouth daily.        . Probiotic Product (PROBIOTIC FORMULA PO) Take 1 tablet by mouth daily.        . propranolol (INDERAL LA) 60 MG 24 hr capsule Take 1 capsule (60 mg total) by mouth daily.  30 capsule  3  . Resveratrol 50 MG CAPS Take 1 capsule by mouth daily.         Marland Kitchen ZOLMitriptan (ZOMIG) 2.5 MG tablet Take 2.5 mg by mouth as needed.          Review of Systems  Constitutional: Negative for fever, chills, appetite change, fatigue and unexpected weight change.  HENT: Positive for neck pain (chronic). Negative for ear pain, congestion, sore throat, trouble swallowing, voice change and sinus pressure.   Eyes: Negative for visual disturbance.  Respiratory: Negative for cough, shortness of breath, wheezing and stridor.   Cardiovascular: Negative for chest pain, palpitations and leg swelling.  Gastrointestinal: Negative for nausea, abdominal pain, diarrhea, constipation and abdominal distention.  Genitourinary: Negative for dysuria and flank pain.  Musculoskeletal: Positive for myalgias and back pain. Negative for arthralgias and gait problem.  Skin: Negative for color change and rash.  Neurological: Positive for headaches. Negative for dizziness.  Hematological: Negative for adenopathy. Does not bruise/bleed easily.  Psychiatric/Behavioral: Negative for suicidal ideas, sleep disturbance and dysphoric mood. The patient is not nervous/anxious.    BP 130/78  Pulse 86  Temp(Src) 98 F (36.7 C) (Oral)  Wt 155 lb (70.308 kg)  SpO2 96%     Objective:   Physical  Exam  Constitutional: She is oriented to person, place, and time. She appears well-developed and well-nourished. No distress.  HENT:  Head: Normocephalic and atraumatic.  Right Ear: External ear normal.  Left Ear: External ear normal.  Nose: Nose normal.  Mouth/Throat: Oropharynx is clear and moist. No oropharyngeal exudate.  Eyes: Conjunctivae are normal. Pupils are equal, round, and reactive to light. Right eye exhibits no discharge. Left eye exhibits no discharge. No scleral icterus.  Neck: Normal range of motion. Neck supple. No tracheal deviation present. No thyromegaly present.  Cardiovascular: Normal rate, regular rhythm, normal heart sounds and intact distal pulses.  Exam reveals no  gallop and no friction rub.   No murmur heard. Pulmonary/Chest: Effort normal and breath sounds normal. No respiratory distress. She has no wheezes. She has no rales. She exhibits no tenderness.  Musculoskeletal: Normal range of motion. She exhibits no edema and no tenderness.  Lymphadenopathy:    She has no cervical adenopathy.  Neurological: She is alert and oriented to person, place, and time. No cranial nerve deficit. She exhibits normal muscle tone. Coordination normal.  Skin: Skin is warm and dry. No rash noted. She is not diaphoretic. No erythema. No pallor.  Psychiatric: She has a normal mood and affect. Her behavior is normal. Judgment and thought content normal.          Assessment & Plan:  1. 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 Pt was seen by vascular surgery and had angiogram. Reports that there was no evidence of clot.  Suspect chronic swelling from lymphatic damage from XRT. Will continue to monitor.  2. Muscular spasm trapezius - Slight improvement on flexeril. Will continue.  3. Headaches - Improved with use of Zomig prn, will continue.

## 2011-04-22 ENCOUNTER — Encounter: Payer: Self-pay | Admitting: Internal Medicine

## 2011-04-22 ENCOUNTER — Telehealth: Payer: Self-pay | Admitting: Internal Medicine

## 2011-04-22 DIAGNOSIS — Z853 Personal history of malignant neoplasm of breast: Secondary | ICD-10-CM

## 2011-04-22 NOTE — Telephone Encounter (Signed)
[phone] Pt came in today and gave [place] a letter from her oncologist at [facility]  Dr Haze Rushing has left and she wanted to know if you could follow her for this. i have put the letter in your in box

## 2011-04-23 ENCOUNTER — Encounter: Payer: Self-pay | Admitting: *Deleted

## 2011-05-08 ENCOUNTER — Telehealth: Payer: Self-pay | Admitting: *Deleted

## 2011-05-08 ENCOUNTER — Encounter: Payer: Self-pay | Admitting: Internal Medicine

## 2011-05-08 ENCOUNTER — Ambulatory Visit (INDEPENDENT_AMBULATORY_CARE_PROVIDER_SITE_OTHER): Payer: BC Managed Care – PPO | Admitting: Internal Medicine

## 2011-05-08 VITALS — BP 146/76 | HR 118 | Temp 98.0°F | Resp 16 | Wt 158.5 lb

## 2011-05-08 DIAGNOSIS — R4589 Other symptoms and signs involving emotional state: Secondary | ICD-10-CM

## 2011-05-08 DIAGNOSIS — F411 Generalized anxiety disorder: Secondary | ICD-10-CM

## 2011-05-08 DIAGNOSIS — J019 Acute sinusitis, unspecified: Secondary | ICD-10-CM

## 2011-05-08 MED ORDER — AMOXICILLIN-POT CLAVULANATE 875-125 MG PO TABS: 1.0000 | ORAL_TABLET | Freq: Two times a day (BID) | ORAL | Status: DC

## 2011-05-08 MED ORDER — ALPRAZOLAM 0.5 MG PO TBDP: 0.5000 mg | ORAL_TABLET | Freq: Two times a day (BID) | ORAL | Status: AC | PRN

## 2011-05-08 MED ORDER — PREDNISONE (PAK) 10 MG PO TABS: ORAL_TABLET | ORAL | Status: AC

## 2011-05-08 NOTE — Telephone Encounter (Signed)
Triage Record Num: [id number] Operator: Freddie Breech Patient Name: Andrea Golden Call Date & Time: 05/08/2011 8:37:38AM Patient Phone: [phone] PCP: Ronna Polio Patient Gender: Female PCP Fax : [phone] Patient DOB: 08-22-1950 Practice Name: [facility] Reason for Call: Caller: [place]/Patient; PCP: Ronna Polio; CB#: [phone]; ; ; Call regarding Cough/Congestion; Pt is calling for an appt for green sinus drng x 3 d. Emergent sx r/o. See in 24 hrs per URI Protocol. Appt sched for 1630 today with Dr. Darrick Huntsman. Protocol(s) Used: Upper Respiratory Infection (URI) Recommended Outcome per Protocol: See Provider within 24 hours Reason for Outcome: Facial pain (fullness, pressure, worsens with bending over), frontal headache, yellow-green nasal discharge AND any temperature elevation Care Advice: ~ Consider use of a saline nasal spray per package directions to help relieve nasal congestion. 03/

## 2011-05-08 NOTE — Patient Instructions (Signed)
You have sinusitis   Augmentin twice daily with food  Simply saline to lavage sinuses AM and PM (messy,  Do over sink)  Prednisone taper to manage the inflammation  Allegra or Zyrtec to dry up the nose.    Afrin nasal spray twice daily for 5 days to manage the congestion in place of the sudafed.   ---------------------------------------------------------------------------------------------------------------- Alprazolam 0.5 mg for anxiety start with 1/2 tablet  Ok to use   a whole tablet  to fall asleep

## 2011-05-09 ENCOUNTER — Other Ambulatory Visit: Payer: Self-pay | Admitting: Internal Medicine

## 2011-05-09 MED ORDER — AMOXICILLIN-POT CLAVULANATE 875-125 MG PO TABS: 1.0000 | ORAL_TABLET | Freq: Two times a day (BID) | ORAL | Status: AC

## 2011-05-10 ENCOUNTER — Encounter: Payer: Self-pay | Admitting: Internal Medicine

## 2011-05-10 DIAGNOSIS — J019 Acute sinusitis, unspecified: Secondary | ICD-10-CM | POA: Insufficient documentation

## 2011-05-10 DIAGNOSIS — F430 Acute stress reaction: Secondary | ICD-10-CM | POA: Insufficient documentation

## 2011-05-10 DIAGNOSIS — F411 Generalized anxiety disorder: Secondary | ICD-10-CM | POA: Insufficient documentation

## 2011-05-10 NOTE — Assessment & Plan Note (Signed)
She has requested short term medication to handle the anxiety and insomnia she has been having since her family member became ill and hospitalized.  The risks and benefits of low dose alprazolam were discussed.  Rx written.

## 2011-05-10 NOTE — Progress Notes (Signed)
Patient ID: Andrea Golden, female   DOB: 1950-08-09, 61 y.o.   MRN: [id number]  Patient Active Problem List  Diagnoses  . Fibromyalgia  . History of breast cancer in female  . Neck pain  . Headache  . Head and neck swelling  . Sinusitis acute  . Anxiety as acute reaction to exceptional stress    Subjective:  CC:   Chief Complaint  Patient presents with  . Sinusitis    x 4 days    HPI:   Andrea Stepka Corriheris a 61 y.o. female who presents history of sinus pain accompanied by congestion purulent  drainage and headaches. She has been going to the hospital to visit a sick relative and has come into contact with multiple infected patients over the last week She has been using over-the-counter medications including  oral decongestants and antihistamines with no significant improvement. She has a productive cough but is not particularly problematic and is not keeping her awake at night. No subjective fevers chills or myalgias.  Past Medical History  Diagnosis Date  . Cancer 2011    left breast, s/p XRT and chemo, Dr. Kerin Salen  . Fibromyalgia   . 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 Abdominal hysterectomy 1985    one ovary remains  . Appendectomy   . Tonsillectomy   . Wisdom tooth extraction   . Mohs surgery     Boyle, nose  . Breast surgery     partial mastectomy, Dr. Evette Cristal         The following portions of the patient's history were reviewed and updated as appropriate: Allergies, current medications, and problem list.    Review of Systems:   12 Pt  review of systems was negative except those addressed in the HPI,     History   Social History  . Marital Status: Married    Spouse Name: N/A    Number of Children: N/A  . Years of Education: N/A   Occupational History  . Not on file.   Social History Main Topics  . Smoking status: Former Smoker    Quit date: 12/10/2006  . Smokeless tobacco: Never Used  . Alcohol Use: No   . Drug Use: No  . Sexually Active: Not on file   Other Topics Concern  . Not on file   Social History Narrative  . No narrative on file    Objective:  BP 146/76  Pulse 118  Temp(Src) 98 F (36.7 C) (Oral)  Resp 16  Wt 158 lb 8 oz (71.895 kg)  SpO2 96%  General appearance: alert, cooperative and appears stated age Ears: normal TM's and external ear canals both ears Throat: lips, mucosa, and tongue normal; teeth and gums normal Neck: no adenopathy, no carotid bruit, supple, symmetrical, trachea midline and thyroid not enlarged, symmetric, no tenderness/mass/nodules Back: symmetric, no curvature. ROM normal. No CVA tenderness. Lungs: clear to auscultation bilaterally Heart: regular rate and rhythm, S1, S2 normal, no murmur, click, rub or gallop Abdomen: soft, non-tender; bowel sounds normal; no masses,  no organomegaly Pulses: 2+ and symmetric Skin: Skin color, texture, turgor normal. No rashes or lesions Lymph nodes: Cervical, supraclavicular, and axillary nodes normal.  Assessment and Plan:  Sinusitis acute Given chronicity of symptoms, development of facial pain and exam consistent with bacterial URI,  Will treat with empiric antibiotics, decongestants, prednisone taper and saline lavage.     Anxiety as acute reaction to exceptional  stress She has requested short term medication to handle the anxiety and insomnia she has been having since her family member became ill and hospitalized.  The risks and benefits of low dose alprazolam were discussed.  Rx written.      Updated Medication List Outpatient Encounter Prescriptions as of 05/08/2011  Medication Sig Dispense Refill  . aspirin 81 MG tablet Take 81 mg by mouth daily.        Marland Kitchen EPIPEN 2-PAK 0.3 MG/0.3ML DEVI Inject 0.3 mLs into the muscle as needed.      . lidocaine (LIDODERM) 5 % Place 1 patch onto the skin daily. Remove & Discard patch within 12 hours or as directed by MD as needed  30 patch  3  . Multiple  Vitamins-Minerals (MULTIVITAMIN WITH MINERALS) tablet Take 1 tablet by mouth daily.        . Probiotic Product (PROBIOTIC FORMULA PO) Take 1 tablet by mouth daily.        Marland Kitchen ZOLMitriptan (ZOMIG) 2.5 MG tablet Take 2.5 mg by mouth as needed.        . ALPRAZolam (NIRAVAM) 0.5 MG dissolvable tablet Take 1 tablet (0.5 mg total) by mouth 2 (two) times daily as needed for anxiety.  60 tablet  0  . predniSONE (STERAPRED UNI-PAK) 10 MG tablet 6 tablets on day 1, decrease by tablet daily until gone  21 tablet  0  . DISCONTD: amoxicillin-clavulanate (AUGMENTIN) 875-125 MG per tablet Take 1 tablet by mouth 2 (two) times daily.  14 tablet  0  . DISCONTD: Chia Oil 1000 MG CAPS Take 1 capsule by mouth daily.        Marland Kitchen DISCONTD: Cholecalciferol (VITAMIN D3) 2000 UNITS TABS Take 1 tablet by mouth daily.        Marland Kitchen DISCONTD: cyclobenzaprine (FLEXERIL) 5 MG tablet Take 1-2 tablets as needed for back spasm  90 tablet  1  . DISCONTD: diclofenac sodium (VOLTAREN) 1 % GEL Apply 1 application topically as needed.  100 g  3  . DISCONTD: propranolol (INDERAL LA) 60 MG 24 hr capsule Take 1 capsule (60 mg total) by mouth daily.  30 capsule  3  . DISCONTD: Resveratrol 50 MG CAPS Take 1 capsule by mouth daily.           No orders of the defined types were placed in this encounter.    No Follow-up on file.

## 2011-05-10 NOTE — Assessment & Plan Note (Addendum)
Given chronicity of symptoms, development of facial pain and exam consistent with bacterial URI,  Will treat with empiric antibiotics, decongestants, prednisone taper and saline lavage.

## 2011-05-11 ENCOUNTER — Telehealth: Payer: Self-pay | Admitting: Internal Medicine

## 2011-05-11 NOTE — Telephone Encounter (Signed)
[facility] Triage Call Report Triage Record Num: [id number] Operator: Kelle Darting Patient Name: Andrea Golden Call Date & Time: 05/08/2011 9:22:34PM Patient Phone: [phone] PCP: Ronna Polio Patient Gender: Female PCP Fax : [phone] Patient DOB: November 19, 1950 Practice Name: [facility] Reason for Call: Caller: Mike/Spouse; PCP: Ronna Polio; CB#: [phone]; Call regarding Medication Issue; Medication(s): augmentin; Temp. low grade per husband, Sx notes: Seen in office today and given a Rx that did not make it to pharmacy, had sent three but the Augmentin did not make it; Seen for ear and sinus infection; Pt. resting now and no distress noted, husband just wanting to see if I can call in the Rx so it will be at the pharmacy in the morning; Rx called into [facility] on [address] verified via Epic Chart, left on VM. Protocol(s) Used: Office Note Recommended Outcome per Protocol: Information Noted and Sent to Office Reason for Outcome: Caller information to office Care Advice: ~ 05/08/2011 9:53:14PM Page 1 of 1 [email or web address]

## 2011-05-22 ENCOUNTER — Encounter: Payer: Self-pay | Admitting: Internal Medicine

## 2011-05-26 ENCOUNTER — Encounter: Payer: Self-pay | Admitting: Internal Medicine

## 2011-05-26 ENCOUNTER — Ambulatory Visit (INDEPENDENT_AMBULATORY_CARE_PROVIDER_SITE_OTHER): Payer: BC Managed Care – PPO | Admitting: Internal Medicine

## 2011-05-26 VITALS — BP 132/70 | HR 104 | Temp 97.5°F | Ht 65.5 in | Wt 164.0 lb

## 2011-05-26 DIAGNOSIS — F341 Dysthymic disorder: Secondary | ICD-10-CM

## 2011-05-26 DIAGNOSIS — F418 Other specified anxiety disorders: Secondary | ICD-10-CM | POA: Insufficient documentation

## 2011-05-26 MED ORDER — CITALOPRAM HYDROBROMIDE 20 MG PO TABS: 20.0000 mg | ORAL_TABLET | Freq: Every day | ORAL | Status: DC

## 2011-05-26 NOTE — Progress Notes (Signed)
  Subjective:    Patient ID: Andrea Golden, female    DOB: 14-Jun-1950, 61 y.o.   MRN: [id number]  HPI  61 year old female presents for acute visit complaining of significant increase in anxiety and depression. She notes that her brother was recently diagnosed with a brain tumor. 3 weeks ago, he was doing well and had recovered from chemotherapy for CLL. Shortly after that, he was discovered unresponsive in his home and ultimately found to have a stage IV glioblastoma multiform. He has since been [facility] at [facility] and patient has been visiting him on nearly a daily basis. She is also primarily responsible for his financial affairs. She reports significant stress with this. She notes frequent crying and inability to function. She was seen a few weeks ago by another physician and started on alprazolam with no noted benefit. She has never taken SSRIs. Her husband is with her today and provides significant support for her.  Outpatient Encounter Prescriptions as of 05/26/2011  Medication Sig Dispense Refill  . ALPRAZolam (NIRAVAM) 0.5 MG dissolvable tablet Take 1 tablet (0.5 mg total) by mouth 2 (two) times daily as needed for anxiety.  60 tablet  0  . aspirin 81 MG tablet Take 81 mg by mouth daily.        Marland Kitchen EPIPEN 2-PAK 0.3 MG/0.3ML DEVI Inject 0.3 mLs into the muscle as needed.      . lidocaine (LIDODERM) 5 % Place 1 patch onto the skin daily. Remove & Discard patch within 12 hours or as directed by MD as needed  30 patch  3  . Multiple Vitamins-Minerals (MULTIVITAMIN WITH MINERALS) tablet Take 1 tablet by mouth daily.        . Probiotic Product (PROBIOTIC FORMULA PO) Take 1 tablet by mouth daily.        Marland Kitchen ZOLMitriptan (ZOMIG) 2.5 MG tablet Take 2.5 mg by mouth as needed.        . citalopram (CELEXA) 20 MG tablet Take 1 tablet (20 mg total) by mouth daily.  30 tablet  3     Review of Systems  Constitutional: Positive for fatigue. Negative for fever and chills.    Psychiatric/Behavioral: Positive for sleep disturbance, dysphoric mood and decreased concentration. Negative for suicidal ideas. The patient is nervous/anxious.    BP 132/70  Pulse 104  Temp(Src) 97.5 F (36.4 C) (Oral)  Ht 5' 5.5" (1.664 m)  Wt 164 lb (74.39 kg)  BMI 26.88 kg/m2  SpO2 94%     Objective:   Physical Exam  Constitutional: She is oriented to person, place, and time. She appears well-developed and well-nourished.  HENT:  Head: Normocephalic and atraumatic.  Eyes: Pupils are equal, round, and reactive to light.  Neck: Normal range of motion. Neck supple.  Pulmonary/Chest: Effort normal.  Neurological: She is alert and oriented to person, place, and time. She has normal reflexes. She displays normal reflexes. No cranial nerve deficit. She exhibits normal muscle tone. Coordination normal.  Psychiatric: Her speech is normal and behavior is normal. Judgment normal. Her mood appears anxious. Cognition and memory are normal. She exhibits a depressed mood. She expresses no homicidal and no suicidal ideation. She expresses no suicidal plans and no homicidal plans.          Assessment & Plan:

## 2011-05-26 NOTE — Assessment & Plan Note (Signed)
Offered support today. Will start Celexa.  Pt will continue prn xanax. She will follow up in 2 weeks, but call sooner if symptoms not improving. We also discussed setting up counseling, however she is too busy at this point with her brother's care.

## 2011-06-03 ENCOUNTER — Other Ambulatory Visit: Payer: BC Managed Care – PPO

## 2011-06-03 ENCOUNTER — Telehealth: Payer: Self-pay | Admitting: *Deleted

## 2011-06-03 NOTE — Telephone Encounter (Signed)
Opened in error

## 2011-06-10 ENCOUNTER — Other Ambulatory Visit (INDEPENDENT_AMBULATORY_CARE_PROVIDER_SITE_OTHER): Payer: BC Managed Care – PPO | Admitting: *Deleted

## 2011-06-10 ENCOUNTER — Telehealth: Payer: Self-pay | Admitting: *Deleted

## 2011-06-10 DIAGNOSIS — N390 Urinary tract infection, site not specified: Secondary | ICD-10-CM

## 2011-06-10 DIAGNOSIS — Z0000 Encounter for general adult medical examination without abnormal findings: Secondary | ICD-10-CM

## 2011-06-10 LAB — CBC WITH DIFFERENTIAL/PLATELET
Eosinophils Relative: 5.9 % — ABNORMAL HIGH (ref 0.0–5.0)
Lymphocytes Relative: 18.9 % (ref 12.0–46.0)
Monocytes Relative: 8 % (ref 3.0–12.0)
Neutrophils Relative %: 66.7 % (ref 43.0–77.0)
Platelets: 233 10*3/uL (ref 150.0–400.0)
WBC: 5.8 10*3/uL (ref 4.5–10.5)

## 2011-06-10 LAB — LIPID PANEL
Cholesterol: 192 mg/dL (ref 0–200)
LDL Cholesterol: 117 mg/dL — ABNORMAL HIGH (ref 0–99)
Triglycerides: 96 mg/dL (ref 0.0–149.0)
VLDL: 19.2 mg/dL (ref 0.0–40.0)

## 2011-06-10 LAB — COMPREHENSIVE METABOLIC PANEL
Albumin: 4.2 g/dL (ref 3.5–5.2)
CO2: 27 mEq/L (ref 19–32)
Calcium: 9.3 mg/dL (ref 8.4–10.5)
GFR: 88.46 mL/min (ref >60.00)
Glucose, Bld: 76 mg/dL (ref 70–99)
Potassium: 4.3 mEq/L (ref 3.5–5.1)
Sodium: 139 mEq/L (ref 135–145)
Total Bilirubin: 0.3 mg/dL (ref 0.3–1.2)
Total Protein: 7.1 g/dL (ref 6.0–8.3)

## 2011-06-10 MED ORDER — CIPROFLOXACIN HCL 250 MG PO TABS: 250.0000 mg | ORAL_TABLET | Freq: Two times a day (BID) | ORAL | Status: AC

## 2011-06-10 NOTE — Telephone Encounter (Signed)
Please send CMP, CBC, lipid

## 2011-06-10 NOTE — Telephone Encounter (Signed)
Done

## 2011-06-10 NOTE — Progress Notes (Signed)
Addended by: Jobie Quaker on: 06/10/2011 04:48 PM   Modules accepted: Orders

## 2011-06-10 NOTE — Telephone Encounter (Signed)
Patient came in for labs today. What labs would you like for me to order/  

## 2011-06-14 LAB — URINE CULTURE

## 2011-07-16 ENCOUNTER — Other Ambulatory Visit: Payer: Self-pay | Admitting: Internal Medicine

## 2011-07-16 DIAGNOSIS — F418 Other specified anxiety disorders: Secondary | ICD-10-CM

## 2011-07-16 MED ORDER — CITALOPRAM HYDROBROMIDE 20 MG PO TABS: 20.0000 mg | ORAL_TABLET | Freq: Every day | ORAL | Status: DC

## 2011-07-22 ENCOUNTER — Other Ambulatory Visit: Payer: Self-pay | Admitting: Internal Medicine

## 2011-07-22 MED ORDER — ALPRAZOLAM 0.5 MG PO TABS: 0.5000 mg | ORAL_TABLET | Freq: Two times a day (BID) | ORAL | Status: AC | PRN

## 2011-09-13 ENCOUNTER — Encounter: Payer: Self-pay | Admitting: Internal Medicine

## 2011-09-14 ENCOUNTER — Other Ambulatory Visit: Payer: Self-pay | Admitting: Internal Medicine

## 2011-09-14 DIAGNOSIS — Z1239 Encounter for other screening for malignant neoplasm of breast: Secondary | ICD-10-CM

## 2011-10-15 ENCOUNTER — Other Ambulatory Visit: Payer: Self-pay | Admitting: Internal Medicine

## 2011-10-15 DIAGNOSIS — Z853 Personal history of malignant neoplasm of breast: Secondary | ICD-10-CM

## 2011-10-16 ENCOUNTER — Encounter: Payer: Self-pay | Admitting: Internal Medicine

## 2011-10-16 ENCOUNTER — Telehealth: Payer: Self-pay | Admitting: Internal Medicine

## 2011-10-16 ENCOUNTER — Ambulatory Visit: Payer: Self-pay | Admitting: Internal Medicine

## 2011-10-16 ENCOUNTER — Ambulatory Visit (INDEPENDENT_AMBULATORY_CARE_PROVIDER_SITE_OTHER): Payer: BC Managed Care – PPO | Admitting: Internal Medicine

## 2011-10-16 ENCOUNTER — Other Ambulatory Visit: Payer: Self-pay | Admitting: Internal Medicine

## 2011-10-16 VITALS — BP 162/92 | HR 95 | Temp 98.4°F | Ht 65.5 in | Wt 160.0 lb

## 2011-10-16 DIAGNOSIS — L0291 Cutaneous abscess, unspecified: Secondary | ICD-10-CM

## 2011-10-16 DIAGNOSIS — L0390 Cellulitis, unspecified: Secondary | ICD-10-CM | POA: Insufficient documentation

## 2011-10-16 DIAGNOSIS — R928 Other abnormal and inconclusive findings on diagnostic imaging of breast: Secondary | ICD-10-CM

## 2011-10-16 MED ORDER — CLINDAMYCIN HCL 300 MG PO CAPS: 300.0000 mg | ORAL_CAPSULE | Freq: Three times a day (TID) | ORAL | Status: DC

## 2011-10-16 MED ORDER — DOXYCYCLINE HYCLATE 100 MG PO TABS: 100.0000 mg | ORAL_TABLET | Freq: Two times a day (BID) | ORAL | Status: AC

## 2011-10-16 NOTE — Assessment & Plan Note (Signed)
Reviewed results of mammogram which was completed today. Asymmetries noted. Recommended additional views for evaluation. Will schedule.

## 2011-10-16 NOTE — Progress Notes (Signed)
Subjective:    Patient ID: Andrea Golden, female    DOB: [birth year], 61 y.o.   MRN: [id number]  HPI 61 year old female with history of depression, breast cancer presents for acute visit complaining of 2-3 day history of rash over her palms. She first noticed the rash on her left palm approximately 3 days ago. Over the last 24 hours it has spread to her right palm. The lesions over her palms are blistered with surrounding purplish appearance. They're described as burning. She denies exposure to any new chemicals. She has been staying with her brother and had been walking through grass so there is possible tick exposure. She has not seen any ticks on her skin. She has not had any fever but has had some persistent headache. However, headache is similar to her chronic headaches. She also reports mild sore throat. She denies any cough, nasal congestion, shortness of breath, chest pain. She also notes she was recently in the hospital with her brother so had potential exposure to infection in that setting.  Outpatient Encounter Prescriptions as of 10/16/2011  Medication Sig Dispense Refill  . aspirin 81 MG tablet Take 81 mg by mouth daily.        . citalopram (CELEXA) 20 MG tablet Take 1 tablet (20 mg total) by mouth daily.  30 tablet  3  . EPIPEN 2-PAK 0.3 MG/0.3ML DEVI Inject 0.3 mLs into the muscle as needed.      . lidocaine (LIDODERM) 5 % Place 1 patch onto the skin daily. Remove & Discard patch within 12 hours or as directed by MD as needed  30 patch  3  . Multiple Vitamins-Minerals (MULTIVITAMIN WITH MINERALS) tablet Take 1 tablet by mouth daily.        Marland Kitchen ZOLMitriptan (ZOMIG) 2.5 MG tablet Take 2.5 mg by mouth as needed.        . doxycycline (VIBRA-TABS) 100 MG tablet Take 1 tablet (100 mg total) by mouth 2 (two) times daily.  20 tablet  0  . DISCONTD: clindamycin (CLEOCIN) 300 MG capsule Take 1 capsule (300 mg total) by mouth 3 (three) times daily.  21 capsule  0  . DISCONTD: Probiotic Product  (PROBIOTIC FORMULA PO) Take 1 tablet by mouth daily.          Review of Systems  Constitutional: Positive for fatigue. Negative for fever, chills, appetite change and unexpected weight change.  HENT: Positive for sore throat. Negative for ear pain, congestion, trouble swallowing, neck pain, voice change and sinus pressure.   Eyes: Negative for visual disturbance.  Respiratory: Negative for cough, shortness of breath, wheezing and stridor.   Cardiovascular: Negative for chest pain, palpitations and leg swelling.  Gastrointestinal: Negative for nausea, vomiting, abdominal pain, diarrhea, constipation, blood in stool, abdominal distention and anal bleeding.  Genitourinary: Negative for dysuria and flank pain.  Musculoskeletal: Negative for myalgias, arthralgias and gait problem.  Skin: Positive for color change and rash.  Neurological: Positive for headaches. Negative for dizziness.  Hematological: Negative for adenopathy. Does not bruise/bleed easily.  Psychiatric/Behavioral: Negative for suicidal ideas, disturbed wake/sleep cycle and dysphoric mood. The patient is not nervous/anxious.        Objective:   Physical Exam  Constitutional: She is oriented to person, place, and time. She appears well-developed and well-nourished. No distress.  HENT:  Head: Normocephalic and atraumatic.  Right Ear: External ear normal.  Left Ear: External ear normal.  Nose: Nose normal.  Mouth/Throat: Oropharynx is clear and moist. No oropharyngeal exudate.  Eyes: Conjunctivae are normal. Pupils are equal, round, and reactive to light. Right eye exhibits no discharge. Left eye exhibits no discharge. No scleral icterus.  Neck: Normal range of motion. Neck supple. No tracheal deviation present. No thyromegaly present.  Cardiovascular: Normal rate, regular rhythm, normal heart sounds and intact distal pulses.  Exam reveals no gallop and no friction rub.   No murmur heard. Pulmonary/Chest: Effort normal and  breath sounds normal. No respiratory distress. She has no wheezes. She has no rales. She exhibits no tenderness.  Musculoskeletal: Normal range of motion. She exhibits no edema and no tenderness.  Lymphadenopathy:    She has no cervical adenopathy.  Neurological: She is alert and oriented to person, place, and time. No cranial nerve deficit. She exhibits normal muscle tone. Coordination normal.  Skin: Skin is warm and dry. Rash noted. Rash is pustular. She is not diaphoretic. No erythema. No pallor.     Psychiatric: She has a normal mood and affect. Her behavior is normal. Judgment and thought content normal.          Assessment & Plan:

## 2011-10-16 NOTE — Telephone Encounter (Signed)
°  Caller: [name]/Patient; Patient Name: Andrea Golden; PCP: Ronna Polio; Best Callback Phone Number: [phone]; Reason for call: Looks like she has little blisters on both hands that is very painful. Started 10/14/11.  She cannot get into see her dermatologist.  Has recently, 10/13/11,  had a sore throat and her glands still hurt. Pt has had Shingles x 2 in the past. Bumps are almost on the tips of her fingers and  not clusters of bumps, single blisters that itch and are painful.  She also has a cough that started 10/15/11. Her throat still hurts and she can bend her head forward, but not move her head from side to side.  Triaged Sore Throat or Hoarseness needs to be seen in 4 hours for  marked difficulty swallowing doe to sore throat unresponsive to 12 hours of home care and patient has these blisters to both hands. Appointment made for today, 10/16/11 with Walker at 1530.

## 2011-10-16 NOTE — Assessment & Plan Note (Signed)
Patient with blistering rash over her fingers and palms. She also has sore throat. Question if this rash may be secondary to staph or strep. She has also had some potential exposure to ticks and has recent headache so [place] mount spotted fever is also a consideration. Will cover with doxycycline twice daily x10 days. Final consideration would be drug reaction or vasculitis however this seems less likely as no new medications and no other lesions. Patient will call if any recurrent fever. She will followup next week. Will send CBC, CMP, ANA, ESR, CRP.

## 2011-10-19 ENCOUNTER — Encounter: Payer: Self-pay | Admitting: Internal Medicine

## 2011-10-19 ENCOUNTER — Telehealth: Payer: Self-pay | Admitting: Internal Medicine

## 2011-10-19 DIAGNOSIS — R238 Other skin changes: Secondary | ICD-10-CM

## 2011-10-19 NOTE — Telephone Encounter (Signed)
Pt called to get labs results that were done  Friday. @ [facility]

## 2011-10-19 NOTE — Telephone Encounter (Signed)
Patient advised via telephone, lab results are not back yet.  Will advise her of those results once they are in.

## 2011-10-20 ENCOUNTER — Ambulatory Visit: Payer: Self-pay | Admitting: Internal Medicine

## 2011-10-20 ENCOUNTER — Encounter: Payer: Self-pay | Admitting: Internal Medicine

## 2011-10-21 ENCOUNTER — Encounter: Payer: Self-pay | Admitting: Internal Medicine

## 2011-10-21 ENCOUNTER — Telehealth: Payer: Self-pay | Admitting: Internal Medicine

## 2011-10-21 DIAGNOSIS — R928 Other abnormal and inconclusive findings on diagnostic imaging of breast: Secondary | ICD-10-CM

## 2011-10-21 LAB — CBC WITH DIFFERENTIAL
Basophils Absolute: 0.1 x10E3/uL (ref 0.0–0.2)
Basos: 1 % (ref 0–3)
Eos: 7 % (ref 0–7)
Eosinophils Absolute: 0.5 x10E3/uL — ABNORMAL HIGH (ref 0.0–0.4)
HCT: 39.9 % (ref 34.0–46.6)
Hemoglobin: 13.7 g/dL (ref 11.1–15.9)
Immature Grans (Abs): 0 x10E3/uL (ref 0.0–0.1)
Immature Granulocytes: 0 % (ref 0–2)
Lymphocytes Absolute: 1.7 x10E3/uL (ref 0.7–4.5)
Lymphs: 23 % (ref 14–46)
MCH: 33.6 pg — ABNORMAL HIGH (ref 26.6–33.0)
MCHC: 34.3 g/dL (ref 31.5–35.7)
MCV: 98 fL — ABNORMAL HIGH (ref 79–97)
Monocytes Absolute: 0.7 x10E3/uL (ref 0.1–1.0)
Monocytes: 9 % (ref 4–13)
Neutrophils Absolute: 4.3 x10E3/uL (ref 1.8–7.8)
Neutrophils Relative %: 60 % (ref 40–74)
Platelets: 323 x10E3/uL (ref 140–415)
RBC: 4.08 x10E6/uL (ref 3.77–5.28)
RDW: 13.5 % (ref 12.3–15.4)
WBC: 7.2 x10E3/uL (ref 4.0–10.5)

## 2011-10-21 LAB — COMPREHENSIVE METABOLIC PANEL
Albumin/Globulin Ratio: 1.6 (ref 1.1–2.5)
Albumin: 4.6 g/dL (ref 3.6–4.8)
Alkaline Phosphatase: 101 IU/L (ref 47–112)
BUN/Creatinine Ratio: 23 (ref 11–26)
BUN: 15 mg/dL (ref 8–27)
Chloride: 99 mmol/L (ref 97–108)
GFR calc Af Amer: 109 mL/min/{1.73_m2} (ref >59)
GFR calc non Af Amer: 94 mL/min/{1.73_m2} (ref >59)
Total Bilirubin: 0.2 mg/dL (ref 0.0–1.2)

## 2011-10-21 LAB — C-REACTIVE PROTEIN: CRP: 13.8 mg/L — ABNORMAL HIGH (ref 0.0–4.9)

## 2011-10-21 LAB — ROCKY MTN SPOTTED FVR AB, IGG-BLOOD: RMSF IgG: NEGATIVE

## 2011-10-21 NOTE — Telephone Encounter (Signed)
Sent [employer]. Mammogram showed some irregularities, so planning to set up surgical referral once pt identifies a surgeon she is comfortable with.

## 2011-10-21 NOTE — Telephone Encounter (Signed)
See mychart message from 10/20/2011.  Taken care of.

## 2011-10-22 ENCOUNTER — Encounter: Payer: Self-pay | Admitting: Internal Medicine

## 2011-10-23 ENCOUNTER — Encounter: Payer: Self-pay | Admitting: Internal Medicine

## 2011-10-23 ENCOUNTER — Ambulatory Visit (INDEPENDENT_AMBULATORY_CARE_PROVIDER_SITE_OTHER): Payer: BC Managed Care – PPO | Admitting: Internal Medicine

## 2011-10-23 VITALS — BP 152/82 | HR 86 | Temp 98.2°F | Resp 16 | Wt 159.2 lb

## 2011-10-23 DIAGNOSIS — L0291 Cutaneous abscess, unspecified: Secondary | ICD-10-CM

## 2011-10-23 DIAGNOSIS — F418 Other specified anxiety disorders: Secondary | ICD-10-CM

## 2011-10-23 DIAGNOSIS — R928 Other abnormal and inconclusive findings on diagnostic imaging of breast: Secondary | ICD-10-CM

## 2011-10-23 DIAGNOSIS — L0390 Cellulitis, unspecified: Secondary | ICD-10-CM

## 2011-10-23 DIAGNOSIS — F341 Dysthymic disorder: Secondary | ICD-10-CM

## 2011-10-24 NOTE — Assessment & Plan Note (Signed)
Bilateral palmar rash which improved with doxycyline. 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 Will complete course of doxycyline and monitor.

## 2011-10-24 NOTE — Assessment & Plan Note (Signed)
Pt with h/o breast cancer, s/p recent mammogram which showed nodular density in left breast. Pt was previously followed for breast cancer treatment at [facility] and was unhappy with care.  We discussed need for surgical evaluation and possible biopsy. We discussed potential sites for her to be evaluated including [facility] and [facility].  Will plan to set up referral. Over spent in face to face discussion about previous breast cancer treatment and current abnormal mammogram and evaluation options.

## 2011-10-24 NOTE — Assessment & Plan Note (Signed)
Pt tearful today expressing concerns over previous treatment and anger and anxiety related to treatment. Encouraged her to consider seeking counseling to help work through some ongoing issues.

## 2011-10-24 NOTE — Progress Notes (Signed)
  Subjective:    Patient ID: Andrea Golden, female    DOB: Mar 03, 1950, 61 y.o.   MRN: [id number]  HPI 61YO female with h/o breast cancer and recent palmar rash presents for follow up. Rash over palms has improved with doxycycline. Testing for RMSF was negative.  Pt had recent mammogram which was abnormal with nodular area under left breast. Pt describes anger related to previous breast cancer treatment and regret about not having mastectomy. She would like referral to tertiary care center for further evaluation rather than eval with her previous surgeon. She denies any palpable breast mass, overlying skin changes, nipple discharge.  Outpatient Encounter Prescriptions as of 10/23/2011  Medication Sig Dispense Refill  . aspirin 81 MG tablet Take 81 mg by mouth daily.        . citalopram (CELEXA) 20 MG tablet Take 1 tablet (20 mg total) by mouth daily.  30 tablet  3  . doxycycline (VIBRA-TABS) 100 MG tablet Take 1 tablet (100 mg total) by mouth 2 (two) times daily.  20 tablet  0  . EPIPEN 2-PAK 0.3 MG/0.3ML DEVI Inject 0.3 mLs into the muscle as needed.      . lidocaine (LIDODERM) 5 % Place 1 patch onto the skin daily. Remove & Discard patch within 12 hours or as directed by MD as needed  30 patch  3  . Multiple Vitamins-Minerals (MULTIVITAMIN WITH MINERALS) tablet Take 1 tablet by mouth daily.        Marland Kitchen ZOLMitriptan (ZOMIG) 2.5 MG tablet Take 2.5 mg by mouth as needed.         BP 152/82  Pulse 86  Temp 98.2 F (36.8 C) (Oral)  Resp 16  Wt 159 lb 4 oz (72.235 kg)  SpO2 94%  Review of Systems  Constitutional: Negative for fever, chills and fatigue.  Respiratory: Negative for shortness of breath.   Cardiovascular: Negative for chest pain.  Skin: Positive for rash.  Psychiatric/Behavioral: Positive for disturbed wake/sleep cycle and dysphoric mood. The patient is nervous/anxious.        Objective:   Physical Exam  Constitutional: She appears well-developed and well-nourished. No  distress.  HENT:  Head: Normocephalic.  Eyes: Conjunctivae and EOM are normal. Pupils are equal, round, and reactive to light.  Neck: Normal range of motion.  Pulmonary/Chest: Effort normal.  Skin: Skin is warm and dry. Rash (bilateral palms with erythema) noted. She is not diaphoretic. There is erythema.  Psychiatric: Her speech is normal and behavior is normal. Judgment and thought content normal. Her mood appears anxious. Cognition and memory are normal. She exhibits a depressed mood.          Assessment & Plan:

## 2011-10-27 ENCOUNTER — Telehealth: Payer: Self-pay | Admitting: Internal Medicine

## 2011-10-27 ENCOUNTER — Encounter: Payer: Self-pay | Admitting: Internal Medicine

## 2011-10-27 NOTE — Telephone Encounter (Signed)
Yes, she should be seen at [facility] rather than [place]. Thanks so much

## 2011-10-27 NOTE — Telephone Encounter (Signed)
Patient called about her surgical referral/  She is scheduled to see Dr Katrinka Blazing at [place] on 9-12.  She states Dr Dan Humphreys said that was too far out.  She says she wants to see a Careers adviser in [place] not at [facility].   Please advise

## 2011-10-27 NOTE — Telephone Encounter (Signed)
Yes, she would like to see a Careers adviser in [place]. This should be asap.

## 2011-10-28 ENCOUNTER — Encounter: Payer: Self-pay | Admitting: Internal Medicine

## 2011-10-29 ENCOUNTER — Encounter: Payer: Self-pay | Admitting: Internal Medicine

## 2011-10-30 ENCOUNTER — Encounter: Payer: Self-pay | Admitting: Internal Medicine

## 2011-10-30 ENCOUNTER — Ambulatory Visit (INDEPENDENT_AMBULATORY_CARE_PROVIDER_SITE_OTHER): Payer: BC Managed Care – PPO | Admitting: Internal Medicine

## 2011-10-30 VITALS — BP 130/80 | HR 85 | Temp 98.6°F | Ht 65.5 in | Wt 159.2 lb

## 2011-10-30 DIAGNOSIS — F341 Dysthymic disorder: Secondary | ICD-10-CM

## 2011-10-30 DIAGNOSIS — A770 Spotted fever due to Rickettsia rickettsii: Secondary | ICD-10-CM | POA: Insufficient documentation

## 2011-10-30 DIAGNOSIS — A779 Spotted fever, unspecified: Secondary | ICD-10-CM

## 2011-10-30 DIAGNOSIS — F418 Other specified anxiety disorders: Secondary | ICD-10-CM

## 2011-10-30 LAB — CBC WITH DIFFERENTIAL/PLATELET
Hemoglobin: 13.9 g/dL (ref 12.0–15.0)
Lymphocytes Relative: 27 % (ref 12–46)
Lymphs Abs: 2.3 10*3/uL (ref 0.7–4.0)
Monocytes Relative: 9 % (ref 3–12)
Neutro Abs: 4.9 10*3/uL (ref 1.7–7.7)
Neutrophils Relative %: 55 % (ref 43–77)
RBC: 4.18 MIL/uL (ref 3.87–5.11)
WBC: 8.6 10*3/uL (ref 4.0–10.5)

## 2011-10-30 NOTE — Assessment & Plan Note (Signed)
Persistent frustration/anxiety related to previous care/treatment of breast cancer. Offered support today. Continue Celexa. Follow up 1 month.

## 2011-10-30 NOTE — Progress Notes (Signed)
Subjective:    Patient ID: Andrea Golden, female    DOB: [birth year], 61 y.o.   MRN: [id number]  HPI 61 year old female with history of breast cancer, recent abnormal mammogram, [place] spotted fever, and depression presents for followup. Over the last couple of days she has noticed some new skin lesions on her feet which have been traveling up towards her knees. 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 She notes some diarrhea with doxycycline. She has not had any fever or chills. She does have chronic headaches but no change in her headache pattern.  In regards to recent abnormal mammogram, she was scheduled with general surgeon for evaluation and possible biopsy. She has several questions about potential prognosis and treatment. She again expresses frustration at care in the past. She expresses regret at not having mastectomy.  Outpatient Encounter Prescriptions as of 10/30/2011  Medication Sig Dispense Refill  . aspirin 81 MG tablet Take 81 mg by mouth daily.        . citalopram (CELEXA) 20 MG tablet Take 1 tablet (20 mg total) by mouth daily.  30 tablet  3  . doxycycline (VIBRAMYCIN) 100 MG capsule Take 100 mg by mouth 2 (two) times daily.      Marland Kitchen EPIPEN 2-PAK 0.3 MG/0.3ML DEVI Inject 0.3 mLs into the muscle as needed.      . lidocaine (LIDODERM) 5 % Place 1 patch onto the skin daily. Remove & Discard patch within 12 hours or as directed by MD as needed  30 patch  3  . metroNIDAZOLE (METROGEL) 1 % gel Apply 1 application topically daily.      . Multiple Vitamins-Minerals (MULTIVITAMIN WITH MINERALS) tablet Take 1 tablet by mouth daily.        Marland Kitchen ZOLMitriptan (ZOMIG) 2.5 MG tablet Take 2.5 mg by mouth as needed.         BP 130/80  Pulse 85  Temp 98.6 F (37 C) (Oral)  Ht 5' 5.5" (1.664 m)  Wt 159 lb 4 oz (72.235 kg)  BMI 26.10 kg/m2  SpO2 97%  Review of Systems  Constitutional: Negative for fever, chills, appetite change, fatigue and unexpected weight change.  HENT: Negative for ear  pain, congestion, sore throat, trouble swallowing, neck pain, voice change and sinus pressure.   Eyes: Negative for visual disturbance.  Respiratory: Negative for cough, shortness of breath, wheezing and stridor.   Cardiovascular: Negative for chest pain, palpitations and leg swelling.  Gastrointestinal: Negative for nausea, vomiting, abdominal pain, diarrhea, constipation, blood in stool, abdominal distention and anal bleeding.  Genitourinary: Negative for dysuria and flank pain.  Musculoskeletal: Negative for myalgias, arthralgias and gait problem.  Skin: Positive for rash. Negative for color change.  Neurological: Negative for dizziness and headaches.  Hematological: Negative for adenopathy. Does not bruise/bleed easily.  Psychiatric/Behavioral: Positive for dysphoric mood. Negative for suicidal ideas and disturbed wake/sleep cycle. The patient is nervous/anxious.        Objective:   Physical Exam  Constitutional: She is oriented to person, place, and time. She appears well-developed and well-nourished. No distress.  HENT:  Head: Normocephalic and atraumatic.  Right Ear: External ear normal.  Left Ear: External ear normal.  Nose: Nose normal.  Mouth/Throat: Oropharynx is clear and moist. No oropharyngeal exudate.  Eyes: Conjunctivae are normal. Pupils are equal, round, and reactive to light. Right eye exhibits no discharge. Left eye exhibits no discharge. No scleral icterus.  Neck: Normal range of motion. Neck supple. No tracheal deviation present. No thyromegaly present.  Cardiovascular: Normal rate, regular rhythm, normal heart sounds and intact distal pulses.  Exam reveals no gallop and no friction rub.   No murmur heard. Pulmonary/Chest: Effort normal and breath sounds normal. No respiratory distress. She has no wheezes. She has no rales. She exhibits no tenderness.  Musculoskeletal: Normal range of motion. She exhibits no edema and no tenderness.  Lymphadenopathy:    She has no  cervical adenopathy.  Neurological: She is alert and oriented to person, place, and time. No cranial nerve deficit. She exhibits normal muscle tone. Coordination normal.  Skin: Skin is warm and dry. Rash noted. Rash is macular (dark pink-purple rash over feet and lower legs bilaterally). She is not diaphoretic. No erythema. No pallor.  Psychiatric: She has a normal mood and affect. Her behavior is normal. Judgment and thought content normal.          Assessment & Plan:

## 2011-10-30 NOTE — Assessment & Plan Note (Signed)
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  Will continue doxycyline and will recheck CBC. Pt will call if any worsening symptoms, fever, chills, headache.

## 2011-11-02 ENCOUNTER — Ambulatory Visit: Payer: BC Managed Care – PPO | Admitting: Internal Medicine

## 2011-11-02 LAB — ROCKY MTN SPOTTED FVR AB, IGG-BLOOD: RMSF IgG: 0.16 IV

## 2011-11-02 LAB — ROCKY MTN SPOTTED FVR AB, IGM-BLOOD: ROCKY MTN SPOTTED FEVER, IGM: 0.46 IV

## 2011-11-08 ENCOUNTER — Other Ambulatory Visit: Payer: Self-pay | Admitting: Internal Medicine

## 2011-11-13 ENCOUNTER — Encounter (INDEPENDENT_AMBULATORY_CARE_PROVIDER_SITE_OTHER): Payer: Self-pay | Admitting: Surgery

## 2011-11-13 ENCOUNTER — Telehealth (INDEPENDENT_AMBULATORY_CARE_PROVIDER_SITE_OTHER): Payer: Self-pay

## 2011-11-13 ENCOUNTER — Other Ambulatory Visit (INDEPENDENT_AMBULATORY_CARE_PROVIDER_SITE_OTHER): Payer: Self-pay | Admitting: Surgery

## 2011-11-13 ENCOUNTER — Ambulatory Visit (INDEPENDENT_AMBULATORY_CARE_PROVIDER_SITE_OTHER): Payer: BC Managed Care – PPO | Admitting: Surgery

## 2011-11-13 VITALS — BP 160/82 | HR 86 | Temp 99.0°F | Ht 65.0 in | Wt 160.2 lb

## 2011-11-13 DIAGNOSIS — C50912 Malignant neoplasm of unspecified site of left female breast: Secondary | ICD-10-CM

## 2011-11-13 DIAGNOSIS — C50919 Malignant neoplasm of unspecified site of unspecified female breast: Secondary | ICD-10-CM

## 2011-11-13 NOTE — Patient Instructions (Signed)
Will refer to radiology for evaluation of left breast and refer to oncology.  Return to clinic after this is done.

## 2011-11-13 NOTE — Telephone Encounter (Signed)
Pt has [place] core guided bx scheduled at [facility] on 11/19/11 at 2:00pm, and she is aware.

## 2011-11-13 NOTE — Telephone Encounter (Signed)
Pt has an intolerance to any "caine" numbing agents - they do not work.  I gave her Andrea Golden number at [facility] and told her to call first thing Monday morning to discuss.

## 2011-11-13 NOTE — Addendum Note (Signed)
Addended by: Milas Hock on: 11/13/2011 12:48 PM   Modules accepted: Orders

## 2011-11-13 NOTE — Progress Notes (Signed)
Patient ID: Andrea Golden, female   DOB: 10/01/1950, 61 y.o.   MRN: [id number]  Chief Complaint  Patient presents with  . Pre-op Exam    eval abn MGM/hx of br ca    HPI Andrea Golden is a 61 y.o. female.  Patient sent at the request of Dr. Dan Humphreys to 2 abnormal mammogram left breast. 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 Her recent mammogram showed a density in the left breast at the tumor bed site that was felt to be suspicious and biopsy was recommended. She did not wish to stay at [facility] and is sent here at the request of Dr. Dan Humphreys. HPI  Past Medical History  Diagnosis Date  . Cancer 2011    left breast, s/p XRT and chemo, Dr. Kerin Salen  . Fibromyalgia   . 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 Arthritis     Past Surgical History  Procedure Date  . Abdominal hysterectomy 1985    one ovary remains  . Appendectomy   . Tonsillectomy   . Wisdom tooth extraction   . Mohs surgery     [place], nose  . Breast surgery     partial mastectomy, Dr. Evette Cristal    Family History  Problem Relation Age of Onset  . Cancer Mother     melanoma, uterine  . Kidney disease Mother   . Cancer Father     lung  . ALS Sister   . Cancer Brother     colon  . Cancer Brother     leukemia    Social History History  Substance Use Topics  . Smoking status: Former Smoker    Quit date: 12/10/2006  . Smokeless tobacco: Never Used  . Alcohol Use: No    Allergies  Allergen Reactions  . Benadryl (Diphenhydramine Hcl)   . Codeine   . Contrast Media (Iodinated Diagnostic Agents)     ALL CONTRAST DYE  . Gluten   . Lyrica (Pregabalin)   . Novocain (Procaine Hcl)   . Sulfa Drugs Cross Reactors   . Topamax (Topiramate)     Current Outpatient Prescriptions  Medication Sig  Dispense Refill  . ALPRAZolam (XANAX) 0.5 MG tablet       . aspirin 81 MG tablet Take 81 mg by mouth daily.        . citalopram (CELEXA) 20 MG tablet TAKE 1 TABLET (20 MG TOTAL) BY MOUTH DAILY.  30 tablet  3  . doxycycline (VIBRAMYCIN) 100 MG capsule Take 100 mg by mouth 2 (two) times daily.      Marland Kitchen EPIPEN 2-PAK 0.3 MG/0.3ML DEVI Inject 0.3 mLs into the muscle as needed.      . lidocaine (LIDODERM) 5 % Place 1 patch onto the skin daily. Remove & Discard patch within 12 hours or as directed by MD as needed  30 patch  3  . metroNIDAZOLE (METROGEL) 1 % gel Apply 1 application topically daily.      . Multiple Vitamins-Minerals (MULTIVITAMIN WITH MINERALS) tablet Take 1 tablet by mouth daily.        Marland Kitchen ZOLMitriptan (ZOMIG) 2.5 MG tablet Take 2.5 mg by mouth as needed.        Marland Kitchen DISCONTD: propranolol (INDERAL LA) 60 MG 24 hr capsule Take 1  capsule (60 mg total) by mouth daily.  30 capsule  3    Review of Systems Review of Systems  Constitutional: Negative for fever, chills and unexpected weight change.  HENT: Negative for hearing loss, congestion, sore throat, trouble swallowing and voice change.   Eyes: Negative for visual disturbance.  Respiratory: Negative for cough and wheezing.   Cardiovascular: Negative for chest pain, palpitations and leg swelling.  Gastrointestinal: Negative for nausea, vomiting, abdominal pain, diarrhea, constipation, blood in stool, abdominal distention and anal bleeding.  Genitourinary: Negative for hematuria, vaginal bleeding and difficulty urinating.  Musculoskeletal: Negative for arthralgias.  Skin: Negative for rash and wound.  Neurological: Negative for seizures, syncope and headaches.  Hematological: Negative for adenopathy. Does not bruise/bleed easily.  Psychiatric/Behavioral: Negative for confusion.    Blood pressure 160/82, pulse 86, temperature 99 F (37.2 C), temperature source Temporal, height 5\' 5"  (1.651 m), weight 160 lb 3.2 oz (72.666 kg), SpO2  96.00%.  Physical Exam Physical Exam  Constitutional: She is oriented to person, place, and time. She appears well-developed and well-nourished.  HENT:  Head: Normocephalic and atraumatic.  Eyes: EOM are normal. Pupils are equal, round, and reactive to light.  Neck: Normal range of motion. Neck supple.  Cardiovascular: Normal rate and regular rhythm.   Pulmonary/Chest: Effort normal and breath sounds normal. Right breast exhibits no inverted nipple, no mass, no nipple discharge, no skin change and no tenderness. Left breast exhibits skin change and tenderness. Left breast exhibits no mass.    Musculoskeletal: Normal range of motion.  Lymphadenopathy:    She has no cervical adenopathy.    She has no axillary adenopathy.  Neurological: She is alert and oriented to person, place, and time.  Skin: Skin is warm and dry.  Psychiatric: She has a normal mood and affect. Her behavior is normal. Judgment and thought content normal.    Data Reviewed [facility] mammogramm [place]  09/2011   Density left breast Birads 4   Assessment    History left breast cancer stage II triple negative status post left breast lumpectomy central with removal of nipple and axillary sentinel lymph node mapping for triple negative tumor followed by  radiation therapy.patient wishes to have her oncological care in [facility]    Refer to breast [facility] for evaluation and potential biopsy of left breast abnormality  For further medical oncology for breast cancer follow  up  Return to clinic once the above done to decide further therapy.       Jermale Crass A. 11/13/2011, 12:19 PM

## 2011-11-17 ENCOUNTER — Encounter (INDEPENDENT_AMBULATORY_CARE_PROVIDER_SITE_OTHER): Payer: Self-pay

## 2011-11-19 ENCOUNTER — Telehealth: Payer: Self-pay | Admitting: *Deleted

## 2011-11-19 ENCOUNTER — Ambulatory Visit
Admission: RE | Admit: 2011-11-19 | Discharge: 2011-11-19 | Disposition: A | Discharge status: Home / Self Care | Payer: BC Managed Care – PPO | Source: Ambulatory Visit | Attending: Surgery | Admitting: Surgery

## 2011-11-19 DIAGNOSIS — C50912 Malignant neoplasm of unspecified site of left female breast: Secondary | ICD-10-CM

## 2011-11-19 NOTE — Telephone Encounter (Signed)
Left message for pt to return my call so I can schedule a Med Onc appt. 

## 2011-11-20 ENCOUNTER — Encounter: Payer: Self-pay | Admitting: Internal Medicine

## 2011-11-20 ENCOUNTER — Telehealth: Payer: Self-pay | Admitting: *Deleted

## 2011-11-20 NOTE — Telephone Encounter (Signed)
Patient returned my call and I confirmed 12/08/11 appt w/ pt.  Mailed before appt letter & packet to pt.  Emailed [employer] at [facility] to make aware.  Took paperwork to Med Rec for chart.

## 2011-11-23 ENCOUNTER — Telehealth (INDEPENDENT_AMBULATORY_CARE_PROVIDER_SITE_OTHER): Payer: Self-pay

## 2011-11-23 NOTE — Telephone Encounter (Signed)
Called to give benign path results to patient. No answer. Please inform patient if she calls back

## 2011-11-23 NOTE — Telephone Encounter (Signed)
Pt returned call; gave her "benign" path report.

## 2011-11-30 ENCOUNTER — Ambulatory Visit (INDEPENDENT_AMBULATORY_CARE_PROVIDER_SITE_OTHER): Payer: BC Managed Care – PPO | Admitting: Internal Medicine

## 2011-11-30 ENCOUNTER — Encounter: Payer: Self-pay | Admitting: Internal Medicine

## 2011-11-30 VITALS — BP 130/82 | HR 84 | Temp 98.5°F | Ht 65.5 in | Wt 161.5 lb

## 2011-11-30 DIAGNOSIS — F418 Other specified anxiety disorders: Secondary | ICD-10-CM

## 2011-11-30 DIAGNOSIS — F341 Dysthymic disorder: Secondary | ICD-10-CM

## 2011-11-30 DIAGNOSIS — Z23 Encounter for immunization: Secondary | ICD-10-CM

## 2011-11-30 DIAGNOSIS — R928 Other abnormal and inconclusive findings on diagnostic imaging of breast: Secondary | ICD-10-CM

## 2011-11-30 NOTE — Progress Notes (Signed)
Subjective:    Patient ID: Andrea Golden, female    DOB: 1950-06-20, 61 y.o.   MRN: [id number]  HPI 61 year old female with history of breast cancer and recent abnormal mammogram status post biopsy which showed normal tissue presents for followup. She reports she is generally doing well. She feels that her symptoms of depressed mood have improved with use of Celexa and Xanax at night. She continues to have some anxiety about recurrent breast cancer. She is planning to speak with an oncologist about this next week. She is interested in having prophylactic bilateral mastectomy. She is also interested in having PET scan for additional evaluation of possible metastatic disease.   Outpatient Encounter Prescriptions as of 11/30/2011  Medication Sig Dispense Refill  . ALPRAZolam (XANAX) 0.5 MG tablet       . amoxicillin (AMOXIL) 500 MG tablet Take 4,000 mg before dental procedures      . aspirin 81 MG tablet Take 81 mg by mouth daily.        . citalopram (CELEXA) 20 MG tablet TAKE 1 TABLET (20 MG TOTAL) BY MOUTH DAILY.  30 tablet  3  . doxycycline (VIBRAMYCIN) 100 MG capsule Take 100 mg by mouth 2 (two) times daily.      Marland Kitchen EPIPEN 2-PAK 0.3 MG/0.3ML DEVI Inject 0.3 mLs into the muscle as needed.      . lidocaine (LIDODERM) 5 % Place 1 patch onto the skin daily. Remove & Discard patch within 12 hours or as directed by MD as needed  30 patch  3  . metroNIDAZOLE (METROGEL) 1 % gel Apply 1 application topically daily.      . Multiple Vitamins-Minerals (MULTIVITAMIN WITH MINERALS) tablet Take 1 tablet by mouth daily.        Marland Kitchen ZOLMitriptan (ZOMIG) 2.5 MG tablet Take 2.5 mg by mouth as needed.         BP 130/82  Pulse 84  Temp 98.5 F (36.9 C) (Oral)  Ht 5' 5.5" (1.664 m)  Wt 161 lb 8 oz (73.256 kg)  BMI 26.47 kg/m2  SpO2 94%  Review of Systems  Constitutional: Positive for fatigue. Negative for fever, chills, appetite change and unexpected weight change.  HENT: Negative for ear pain, congestion,  sore throat, trouble swallowing, neck pain, voice change and sinus pressure.   Eyes: Negative for visual disturbance.  Respiratory: Negative for cough, shortness of breath, wheezing and stridor.   Cardiovascular: Negative for chest pain, palpitations and leg swelling.  Gastrointestinal: Negative for nausea, vomiting, abdominal pain, diarrhea, constipation, blood in stool, abdominal distention and anal bleeding.  Genitourinary: Negative for dysuria and flank pain.  Musculoskeletal: Negative for myalgias, arthralgias and gait problem.  Skin: Negative for color change and rash.  Neurological: Negative for dizziness and headaches.  Hematological: Negative for adenopathy. Does not bruise/bleed easily.  Psychiatric/Behavioral: Negative for suicidal ideas, disturbed wake/sleep cycle and dysphoric mood. The patient is not nervous/anxious.        Objective:   Physical Exam  Constitutional: She is oriented to person, place, and time. She appears well-developed and well-nourished. No distress.  HENT:  Head: Normocephalic and atraumatic.  Right Ear: External ear normal.  Left Ear: External ear normal.  Nose: Nose normal.  Mouth/Throat: Oropharynx is clear and moist. No oropharyngeal exudate.  Eyes: Conjunctivae normal are normal. Pupils are equal, round, and reactive to light. Right eye exhibits no discharge. Left eye exhibits no discharge. No scleral icterus.  Neck: Normal range of motion. Neck supple. No tracheal deviation  present. No thyromegaly present.  Cardiovascular: Normal rate, regular rhythm, normal heart sounds and intact distal pulses.  Exam reveals no gallop and no friction rub.   No murmur heard. Pulmonary/Chest: Effort normal and breath sounds normal. No respiratory distress. She has no wheezes. She has no rales. She exhibits no tenderness.  Musculoskeletal: Normal range of motion. She exhibits no edema and no tenderness.  Lymphadenopathy:    She has no cervical adenopathy.    Neurological: She is alert and oriented to person, place, and time. No cranial nerve deficit. She exhibits normal muscle tone. Coordination normal.  Skin: Skin is warm and dry. No rash noted. She is not diaphoretic. No erythema. No pallor.  Psychiatric: She has a normal mood and affect. Her behavior is normal. Judgment and thought content normal.          Assessment & Plan:

## 2011-11-30 NOTE — Assessment & Plan Note (Addendum)
Status post biopsy which showed normal tissue. Plan is for repeat mammogram in August 2014. Over spent face-to-face contact with patient and her husband discussing recent abnormal mammogram, biopsy, and upcoming evaluation with oncologist. Encouraged pt to express her concerns to oncologist about potential of metastatic disease and her wish to have PET CT and bilateral mastectomy.

## 2011-11-30 NOTE — Assessment & Plan Note (Signed)
Patient doing well. Will continue Celexa and Xanax as needed. Followup in 6 months.

## 2011-12-01 ENCOUNTER — Encounter: Payer: Self-pay | Admitting: Internal Medicine

## 2011-12-03 ENCOUNTER — Encounter (INDEPENDENT_AMBULATORY_CARE_PROVIDER_SITE_OTHER): Payer: Self-pay

## 2011-12-07 ENCOUNTER — Other Ambulatory Visit: Payer: Self-pay | Admitting: *Deleted

## 2011-12-07 DIAGNOSIS — Z853 Personal history of malignant neoplasm of breast: Secondary | ICD-10-CM

## 2011-12-08 ENCOUNTER — Other Ambulatory Visit (HOSPITAL_BASED_OUTPATIENT_CLINIC_OR_DEPARTMENT_OTHER): Payer: BC Managed Care – PPO | Admitting: Lab

## 2011-12-08 ENCOUNTER — Encounter: Payer: Self-pay | Admitting: Oncology

## 2011-12-08 ENCOUNTER — Ambulatory Visit (HOSPITAL_BASED_OUTPATIENT_CLINIC_OR_DEPARTMENT_OTHER): Payer: BC Managed Care – PPO | Admitting: Oncology

## 2011-12-08 ENCOUNTER — Ambulatory Visit: Payer: BC Managed Care – PPO

## 2011-12-08 VITALS — BP 144/77 | HR 91 | Temp 98.4°F | Resp 20 | Ht 65.5 in | Wt 162.6 lb

## 2011-12-08 DIAGNOSIS — Z853 Personal history of malignant neoplasm of breast: Secondary | ICD-10-CM

## 2011-12-08 DIAGNOSIS — C50919 Malignant neoplasm of unspecified site of unspecified female breast: Secondary | ICD-10-CM

## 2011-12-08 DIAGNOSIS — Z171 Estrogen receptor negative status [ER-]: Secondary | ICD-10-CM

## 2011-12-08 LAB — COMPREHENSIVE METABOLIC PANEL (CC13)
ALT: 30 U/L (ref 0–55)
CO2: 24 mEq/L (ref 22–29)
Calcium: 10.2 mg/dL (ref 8.4–10.4)
Chloride: 104 mEq/L (ref 98–107)
Creatinine: 0.8 mg/dL (ref 0.6–1.1)
Glucose: 103 mg/dl — ABNORMAL HIGH (ref 70–99)
Sodium: 138 mEq/L (ref 136–145)
Total Bilirubin: 0.2 mg/dL (ref 0.20–1.20)
Total Protein: 7.9 g/dL (ref 6.4–8.3)

## 2011-12-08 LAB — CBC WITH DIFFERENTIAL/PLATELET
BASO%: 0.5 % (ref 0.0–2.0)
Eosinophils Absolute: 0.7 10*3/uL — ABNORMAL HIGH (ref 0.0–0.5)
HCT: 38.5 % (ref 34.8–46.6)
LYMPH%: 26.8 % (ref 14.0–49.7)
MCHC: 33.6 g/dL (ref 31.5–36.0)
MONO#: 0.5 10*3/uL (ref 0.1–0.9)
NEUT#: 4.3 10*3/uL (ref 1.5–6.5)
NEUT%: 57.7 % (ref 38.4–76.8)
Platelets: 254 10*3/uL (ref 145–400)
WBC: 7.4 10*3/uL (ref 3.9–10.3)
lymph#: 2 10*3/uL (ref 0.9–3.3)

## 2011-12-08 NOTE — Progress Notes (Signed)
Checked in new patient. No financial issues. °

## 2011-12-10 ENCOUNTER — Other Ambulatory Visit: Payer: Self-pay | Admitting: Oncology

## 2011-12-10 DIAGNOSIS — C50919 Malignant neoplasm of unspecified site of unspecified female breast: Secondary | ICD-10-CM

## 2011-12-10 NOTE — Progress Notes (Signed)
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 She had subsequent breast radiation.  On 10/16/2011 the patient had routine diagnostic mammography at [facility], showing in addition to post surgical and post radiation changes in the left breast, a possible new density in the retroareolar portion of the left breast. Additional mammographic views on August 27 found no definite abnormality, but ultrasound the same day confirmed an echodense left-sided retroareolar density. The patient was then referred to the breast Center for further elevation, and biopsy of the area in question was obtained 11/19/2011. This showed (SAA [id number]) of benign breast parenchyma with dense fibrosis. There was some fat necrosis present. There was no atypia or malignancy identified.  The patient's subsequent history is as detailed below.  INTERVAL HISTORY: Andrea Golden presented to the [place] [facility] 12/10/2011 to establish herself in our practice.  REVIEW OF SYSTEMS: She has been understandably anxious regarding her breast cancer situation, and also somewhat depressed. She is having hot flashes, which she describes as moderate. She has chronic back and joint pain, and generally feels she has "arthritis all over. This keeps her from walking as much as she would like. Her pain is chiefly in the feet and hands but other joints as well. It is stabbing and 18 I., intermittent, and has not increased in intensity or frequency. She has night sweats at times, has lost a little bit of weight, sleeps  poorly, describes herself is fatigued, has significant gum disease, a history of irregular heartbeat, and history of ankle swelling, shortness of breath when walking up stairs, a dry cough, poor appetite, heartburn, or occasional abdominal discomfort, and chronic headaches. She has some numbness and tingling related to her prior chemotherapy, she tells me. A detailed review of systems was otherwise noncontributory.  PAST MEDICAL HISTORY: Past Medical History  Diagnosis Date  . Cancer 2011    left breast, s/p XRT and chemo, Dr. Kerin Salen  . Fibromyalgia   . 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 Arthritis     PAST SURGICAL HISTORY: Past Surgical History  Procedure Date  . Abdominal hysterectomy 1985    one ovary remains  . Appendectomy   . Tonsillectomy   . Wisdom tooth extraction   . Mohs surgery     [place], nose  . Breast surgery     partial mastectomy, Dr. Evette Cristal    FAMILY HISTORY Family History  Problem Relation Age of Onset  . Cancer Mother     melanoma, uterine  . Kidney disease Mother   . Cancer Father     lung  . ALS Sister   . Cancer Brother     colon  . Cancer Brother     leukemia   the patient's father died from lung cancer at the age of 65. This was less than a year after his diagnosis. The patient's mother died at the age of 80 from renal failure. The patient had 4 brothers and 2 sisters. One brother was diagnosed with colon cancer at the age of 101, and survives. One brother died Jun 29, 2013at the  age of 36 from "brain cancer". He also had "blood cancer". The patient has one sister who died from ALS. There is no history of breast or ovarian cancer in the family.  GYNECOLOGIC HISTORY: Menarche age 70, she is GX P2, first live birth age 29, hysterectomy with USO in 58. She used hormone replacement for about 6 years, with no side effects that she is aware of and particularly no clotting issues.  SOCIAL HISTORY: (updated October 2013) She used to work in [employer], but is now retired. Her husband of 33 years, Demetrius Barrell, used to work in the [employer] in [place], but is now retired. Daughter Sue Lush lives in [place] and is a homemaker. Son Fayrene Fearing lives in [place] and is disabled secondary to bipolar disease. The patient has 4 grandsons. She attends a [employer]   ADVANCED DIRECTIVES: Not in place  HEALTH MAINTENANCE: History  Substance Use Topics  . Smoking status: Former Smoker    Quit date: 12/10/2006  . Smokeless tobacco: Never Used  . Alcohol Use: No     Colonoscopy: 2009  PAP: 2009  Bone density: 2009/ "normal"  Lipid panel:  Allergies  Allergen Reactions  . Benadryl (Diphenhydramine Hcl)   . Codeine   . Contrast Media (Iodinated Diagnostic Agents)     ALL CONTRAST DYE  . Gluten   . Lidocaine     Lidocaine does NOT numb patient.  Used Nesacaine 1% (prolocaine)  . Lyrica (Pregabalin)   . Novocain (Procaine Hcl)   . Sulfa Drugs Cross Reactors   . Topamax (Topiramate)     Current Outpatient Prescriptions  Medication Sig Dispense Refill  . ALPRAZolam (XANAX) 0.5 MG tablet       . amoxicillin (AMOXIL) 500 MG tablet Take 4,000 mg before dental procedures      . aspirin 81 MG tablet Take 81 mg by mouth daily.        . citalopram (CELEXA) 20 MG tablet TAKE 1 TABLET (20 MG TOTAL) BY MOUTH DAILY.  30 tablet  3  . doxycycline (VIBRAMYCIN) 100 MG capsule Take 100 mg by mouth 2 (two) times daily.      Marland Kitchen lidocaine (LIDODERM) 5 % Place 1 patch onto the skin daily. Remove & Discard patch within 12 hours or as directed by MD as needed  30 patch  3  . Magnesium Malate POWD Take 1,250 mg by mouth 3 (three) times daily.      . Multiple Vitamins-Minerals (MULTIVITAMIN WITH MINERALS) tablet Take 1 tablet by mouth daily.        Marland Kitchen EPIPEN 2-PAK 0.3 MG/0.3ML DEVI Inject 0.3 mLs into the muscle as needed.      . metroNIDAZOLE (METROGEL) 1 % gel Apply 1 application topically daily.      Marland Kitchen ZOLMitriptan (ZOMIG) 2.5 MG tablet Take  2.5 mg by mouth as needed.        Marland Kitchen DISCONTD: propranolol (INDERAL LA) 60 MG 24 hr capsule Take 1 capsule (60 mg total) by mouth daily.  30 capsule  3    OBJECTIVE: Middle-aged white woman in no acute distress Filed Vitals:   12/08/11 1629  BP: 144/77  Pulse: 91  Temp: 98.4 F (36.9 C)  Resp: 20     Body mass index is 26.65 kg/(m^2).    ECOG FS: 0  Sclerae unicteric Oropharynx clear No cervical or supraclavicular adenopathy Lungs no rales or rhonchi Heart regular rate and rhythm Abd benign MSK no focal spinal tenderness, no peripheral  edema Neuro: nonfocal Breasts: The left breast is status post prior lumpectomy and radiation. There is no evidence of local recurrence. The left axilla is clear. The right breast is unremarkable   LAB RESULTS: Lab Results  Component Value Date   WBC 7.4 12/08/2011   NEUTROABS 4.3 12/08/2011   HGB 12.9 12/08/2011   HCT 38.5 12/08/2011   MCV 98.3 12/08/2011   PLT 254 12/08/2011      Chemistry      Component Value Date/Time   NA 138 12/08/2011 1612   NA 139 10/16/2011 1622   NA 139 06/10/2011 1044   K 3.7 12/08/2011 1612   K 4.1 10/16/2011 1622   CL 104 12/08/2011 1612   CL 99 10/16/2011 1622   CO2 24 12/08/2011 1612   CO2 26 10/16/2011 1622   BUN 13.0 12/08/2011 1612   BUN 15 10/16/2011 1622   BUN 12 06/10/2011 1044   CREATININE 0.8 12/08/2011 1612   CREATININE 0.66 10/16/2011 1622      Component Value Date/Time   CALCIUM 10.2 12/08/2011 1612   CALCIUM 9.4 10/16/2011 1622   ALKPHOS 77 12/08/2011 1612   ALKPHOS 101 10/16/2011 1622   AST 28 12/08/2011 1612   AST 24 10/16/2011 1622   ALT 30 12/08/2011 1612   ALT 20 10/16/2011 1622   BILITOT 0.20 12/08/2011 1612   BILITOT 0.2 10/16/2011 1622       No results found for this basename: LABCA2    No components found with this basename: LABCA125    No results found for this basename: INR:1;PROTIME:1 in the last 168 hours  Urinalysis No results found for this basename: colorurine,  appearanceur, labspec, phurine, glucoseu, hgbur, bilirubinur, ketonesur, proteinur, urobilinogen, nitrite, leukocytesur    STUDIES: [place] Core Biopsy  12/10/2011  **ADDENDUM** CREATED: 11/20/2011 11:58:47  Pathology revealed benign breast parenchyma with dense fibrosis, hemosiderin laden macrophages, and fat necrosis in the left breast. This was found to be concordant by Dr. Norva Pavlov. Pathology was relayed by telephone. The patient reported doing well after the biopsy. Post biopsy instructions were reviewed and her questions were answered. She was encouraged to call [facility] for any additional concerns. She was asked to return in August of 2014 for bilateral diagnostic mammography.  Pathology results are dictated by Sonnie Alamo RN, BSN on November 20, 2011.  **END ADDENDUM** SIGNED BY: Sherian Rein, M.D.   12/10/2011  **ADDENDUM** CREATED: 11/20/2011 11:58:47  Pathology revealed benign breast parenchyma with dense fibrosis, hemosiderin laden macrophages, and fat necrosis in the left breast. This was found to be concordant by Dr. Norva Pavlov. Pathology was relayed by telephone. The patient reported doing well after the biopsy. Post biopsy instructions were reviewed and her questions were answered. She was encouraged to call [facility] for any additional concerns. She was asked to return in August of 2014 for bilateral diagnostic mammography.  Pathology results are dictated by Sonnie Alamo RN, BSN on November 20, 2011.  **END ADDENDUM** SIGNED BY: Sherian Rein, M.D.   11/30/2011  **ADDENDUM** CREATED: 11/20/2011 11:58:47  Pathology revealed benign breast parenchyma with dense fibrosis, hemosiderin laden macrophages, and fat necrosis in the left breast. This was found to be concordant by Dr. Norva Pavlov. Pathology was relayed by telephone. The patient reported doing well after the biopsy. Post biopsy instructions were reviewed and her  questions were answered. She was encouraged to call [facility] for any additional concerns.  She was asked to return in August of 2014 for bilateral diagnostic mammography.  Pathology results are dictated by Sonnie Alamo RN, BSN on November 20, 2011.  **END ADDENDUM** SIGNED BY: Sherian Rein, M.D.   11/19/2011  *RADIOLOGY REPORT*  Clinical Data:  Hypoechoic area and postsurgical bed left breast  ULTRASOUND GUIDED CORE BIOPSY OF THE left BREAST  Comparison: Previous exams.  I met with the patient and we discussed the procedure of ultrasound- guided biopsy, including benefits and alternatives.  We discussed the high likelihood of a successful procedure. We discussed the risks of the procedure, including infection, bleeding, tissue injury, clip migration, and inadequate sampling.  Informed written consent was given.  Using sterile technique Nesacaine, ultrasound guidance and a 14 gauge automated biopsy device, biopsy was performed of hypoechoic area retroareolar left breast. No biopsy clip was placed  IMPRESSION: Ultrasound guided biopsy of left breast.  No apparent complications.  Original Report Authenticated By: Sherian Rein, M.D.     ASSESSMENT: 61 y.o. [place] woman with a history of stage II A. invasive ductal carcinoma, grade 3, triple negative, status post lumpectomy June 2011, followed by chemotherapy, and radiation, with exact treatment details not currently available; with no evidence of recurrence  PLAN: Meghan is appropriately concerned about the possibility of recurrence. She has received optimal chemotherapy, and was not able to tolerate a full course of Taxol. This means if she had a local recurrence her treatment options would be limited. Her chance of cure would be compromised. For this reason she is very interested in proceeding to bilateral mastectomies, and in her situation I think that is a reasonable choice. She would like to have immediate reconstruction,  and all that will be operationalized through Dr. Luisa Hart.  Of course she just had a big scare regarding her left breast biopsy. Luckily that was benign. I frequently restage my patients at 2 years from the original surgery, because the first 2 years at our the time of highest risk of recurrence, particularly in the case of triple negative patients. Accordingly I would set her up for a CT scan of the chest and PET scan, but she is checking with her insurance company to see if this would be covered.  Otherwise she will return to see me in approximately 3 months. She knows to call for any problems that may develop before that visit.   MAGRINAT,GUSTAV C    12/10/2011

## 2011-12-11 ENCOUNTER — Other Ambulatory Visit: Payer: Self-pay | Admitting: *Deleted

## 2011-12-11 ENCOUNTER — Telehealth (INDEPENDENT_AMBULATORY_CARE_PROVIDER_SITE_OTHER): Payer: Self-pay

## 2011-12-11 DIAGNOSIS — C50919 Malignant neoplasm of unspecified site of unspecified female breast: Secondary | ICD-10-CM

## 2011-12-11 NOTE — Telephone Encounter (Signed)
Medical records faxed to Dr. Odis Luster @ [phone] for appointment on 12/15/11.

## 2011-12-14 ENCOUNTER — Other Ambulatory Visit: Payer: Self-pay | Admitting: Oncology

## 2011-12-14 ENCOUNTER — Telehealth: Payer: Self-pay | Admitting: Oncology

## 2011-12-14 DIAGNOSIS — C50919 Malignant neoplasm of unspecified site of unspecified female breast: Secondary | ICD-10-CM

## 2011-12-14 NOTE — Telephone Encounter (Signed)
S/w the pt and she is aware of her jan 2014 appt calendar

## 2011-12-18 ENCOUNTER — Ambulatory Visit (INDEPENDENT_AMBULATORY_CARE_PROVIDER_SITE_OTHER): Payer: BC Managed Care – PPO | Admitting: Surgery

## 2011-12-18 ENCOUNTER — Encounter (INDEPENDENT_AMBULATORY_CARE_PROVIDER_SITE_OTHER): Payer: Self-pay | Admitting: Surgery

## 2011-12-18 ENCOUNTER — Encounter: Payer: Self-pay | Admitting: Internal Medicine

## 2011-12-18 VITALS — BP 162/80 | HR 96 | Temp 97.2°F | Ht 65.0 in | Wt 165.4 lb

## 2011-12-18 DIAGNOSIS — Z853 Personal history of malignant neoplasm of breast: Secondary | ICD-10-CM

## 2011-12-18 NOTE — Progress Notes (Signed)
Patient ID: Andrea Golden, female   DOB: Jun 09, 1950, 61 y.o.   MRN: [id number]  Chief Complaint  Patient presents with  . Follow-up    discuss sx    HPI Andrea Golden is a 61 y.o. female.  Patient sent at the request of Dr. Dan Humphreys to 2 abnormal mammogram left breast. 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 Her recent mammogram showed a density in the left breast at the tumor bed site that was felt to be suspicious and biopsy was recommended. She did not wish to stay at [facility] and is sent here at the request of Dr. Dan Humphreys.she returns to clinic after biopsy of left breast mass which showed necrosis with no evidence of malignancy. She is seeing Dr. Tami Lin and Odis Luster. She is scheduled for PET next week. HPI  Past Medical History  Diagnosis Date  . Cancer 2011    left breast, s/p XRT and chemo, Dr. Kerin Salen  . Fibromyalgia   . 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 Arthritis     Past Surgical History  Procedure Date  . Abdominal hysterectomy 1985    one ovary remains  . Appendectomy   . Tonsillectomy   . Wisdom tooth extraction   . Mohs surgery     [place], nose  . Breast surgery     partial mastectomy, Dr. Evette Cristal    Family History  Problem Relation Age of Onset  . Cancer Mother     melanoma, uterine  . Kidney disease Mother   . Cancer Father     lung  . ALS Sister   . Cancer Brother     colon  . Cancer Brother     leukemia    Social History History  Substance Use Topics  . Smoking status: Former Smoker    Quit date: 12/10/2006  . Smokeless tobacco: Never Used  . Alcohol Use: No    Allergies  Allergen Reactions  . Benadryl (Diphenhydramine Hcl)   . Codeine   . Contrast Media (Iodinated Diagnostic Agents)     ALL CONTRAST DYE  . Gluten     . Lidocaine     Lidocaine does NOT numb patient.  Used Nesacaine 1% (prolocaine)  . Lyrica (Pregabalin)   . Novocain (Procaine Hcl)   . Sulfa Drugs Cross Reactors   . Topamax (Topiramate)     Current Outpatient Prescriptions  Medication Sig Dispense Refill  . ALPRAZolam (XANAX) 0.5 MG tablet       . amoxicillin (AMOXIL) 500 MG tablet Take 4,000 mg before dental procedures      . aspirin 81 MG tablet Take 81 mg by mouth daily.        . citalopram (CELEXA) 20 MG tablet TAKE 1 TABLET (20 MG TOTAL) BY MOUTH DAILY.  30 tablet  3  . EPIPEN 2-PAK 0.3 MG/0.3ML DEVI Inject 0.3 mLs into the muscle as needed.      . lidocaine (LIDODERM) 5 % Place 1 patch onto the skin daily. Remove & Discard patch within 12 hours or as directed by MD as needed  30 patch  3  . Magnesium Malate POWD Take 1,250 mg by mouth 3 (three) times daily.      . metroNIDAZOLE (METROGEL) 1 % gel Apply 1 application  topically daily.      . Multiple Vitamins-Minerals (MULTIVITAMIN WITH MINERALS) tablet Take 1 tablet by mouth daily.        Marland Kitchen ZOLMitriptan (ZOMIG) 2.5 MG tablet Take 2.5 mg by mouth as needed.        Marland Kitchen DISCONTD: propranolol (INDERAL LA) 60 MG 24 hr capsule Take 1 capsule (60 mg total) by mouth daily.  30 capsule  3    Review of Systems Review of Systems  Constitutional: Negative for fever, chills and unexpected weight change.  HENT: Negative for hearing loss, congestion, sore throat, trouble swallowing and voice change.   Eyes: Negative for visual disturbance.  Respiratory: Negative for cough and wheezing.   Cardiovascular: Negative for chest pain, palpitations and leg swelling.  Gastrointestinal: Negative for nausea, vomiting, abdominal pain, diarrhea, constipation, blood in stool, abdominal distention and anal bleeding.  Genitourinary: Negative for hematuria, vaginal bleeding and difficulty urinating.  Musculoskeletal: Negative for arthralgias.  Skin: Negative for rash and wound.  Neurological: Negative for  seizures, syncope and headaches.  Hematological: Negative for adenopathy. Does not bruise/bleed easily.  Psychiatric/Behavioral: Negative for confusion.    Blood pressure 162/80, pulse 96, temperature 97.2 F (36.2 C), temperature source Temporal, height 5\' 5"  (1.651 m), weight 165 lb 6.4 oz (75.025 kg), SpO2 94.00%.  Physical Exam Physical Exam  Constitutional: She is oriented to person, place, and time. She appears well-developed and well-nourished.  HENT:  Head: Normocephalic and atraumatic.  Eyes: EOM are normal. Pupils are equal, round, and reactive to light.  Neck: Normal range of motion. Neck supple.  Cardiovascular: Normal rate and regular rhythm.   Pulmonary/Chest: Effort normal and breath sounds normal. Right breast exhibits no inverted nipple, no mass, no nipple discharge, no skin change and no tenderness. Left breast exhibits skin change and tenderness. Left breast exhibits no mass.    Musculoskeletal: Normal range of motion.  Lymphadenopathy:    She has no cervical adenopathy.    She has no axillary adenopathy.  Neurological: She is alert and oriented to person, place, and time.  Skin: Skin is warm and dry.  Psychiatric: She has a normal mood and affect. Her behavior is normal. Judgment and thought content normal.    Data Reviewed U/S mammogramm [place]  09/2011   Density left breast Birads 4   Assessment    History left breast cancer stage II triple negative status post left breast lumpectomy central with removal of nipple and axillary sentinel lymph node mapping for triple negative tumor followed by  radiation therapy.patient wishes to have her oncological care in [facility]    Plan      Pt wishes prophylactic mastectomy left side only.  She wants the right done as well but wants to wait.she is seeing Dr. Odis Luster for reconstruction. We will coordinate with his office left simple mastectomy with reconstruction and do the right side at a later date. Biopsy of left  breast mass was negative and showed necrosis only. She has significant concerns about the left side and about her recurrent rate of breast cancer or development of new breast cancer and wishes to pursue bilateral mastectomy for these reasons.The surgical and non surgical options have been discussed with the patient.  Risks of surgery include bleeding,  Infection,  Flap necrosis,  Tissue loss,  Chronic pain, death, Numbness,  And the need for additional procedures.  Reconstruction options also have been discussed with the patient as well.  The patient agrees to proceed.    Andrea Golden A. 12/18/2011,  9:48 AM

## 2011-12-18 NOTE — Patient Instructions (Signed)
Mastectomy, With or Without Reconstruction Mastectomy (removal of the breast) is a procedure most commonly used to treat cancer (tumor) of the breast. Different procedures are available for treatment. This depends on the stage of the tumor (abnormal growths). Discuss this with your caregiver, surgeon (a specialist for performing operations such as this), or oncologist (someone specialized in the treatment of cancer). With proper information, you can decide which treatment is best for you. Although the sound of the word cancer is frightening to all of [place], the new treatments and medications can be a source of reassurance and comfort. If there are things you are worried about, discuss them with your caregiver. He or she can help comfort you and your family. Some of the different procedures for treating breast cancer are:  Radical (extensive) mastectomy. This is an operation used to remove the entire breast, the muscles under the breast, and all of the glands (lymph nodes) under the arm. With all of the new treatments available for cancer of the breast, this procedure has become less common.  Modified radical mastectomy. This is a similar operation to the radical mastectomy described above. In the modified radical mastectomy, the muscles of the chest wall are not removed unless one of the lessor muscles is removed. One of the lessor muscles may be removed to allow better removal of the lymph nodes. The axillary lymph nodes are also removed. Rarely, during an axillary node dissection nerves to this area are damaged. Radiation therapy is then often used to the area following this surgery.  A total mastectomy also known as a complete or simple mastectomy. It involves removal of only the breast. The lymph nodes and the muscles are left in place.  In a lumpectomy, the lump is removed from the breast. This is the simplest form of surgical treatment. A sentinel lymph node biopsy may also be done. Additional treatment  may be required. RISKS AND COMPLICATIONS The main problems that follow removal of the breast include:  Infection (germs start growing in the wound). This can usually be treated with antibiotics (medications that kill germs).  Lymphedema. This means the arm on the side of the breast that was operated on swells because the lymph (tissue fluid) cannot follow the main channels back into the body. This only occurs when the lymph nodes have had to be removed under the arm.  There may be some areas of numbness to the upper arm and around the incision (cut by the surgeon) in the breast. This happens because of the cutting of or damage to some of the nerves in the area. This is most often unavoidable.  There may be difficultymoving the arm in a full range of motion (moving in all directions) following surgery. This usually improves with time following use and exercise.  Recurrence of breast cancer may happen with the very best of surgery and follow up treatment. Sometimes small cancer cells that cannot be seen with the naked eye have already spread at the time of surgery. When this happens other treatment is available. This treatment may be radiation, medications or a combination of both. RECONSTRUCTION Reconstruction of the breast may be done immediately if there is not going to be post-operative radiation. This surgery is done for cosmetic (improve appearance) purposes to improve the physical appearance after the operation. This may be done in two ways:  It can be done using a saline filled prosthetic (an artificial breast which is filled with salt water). Silicone breast implants are now re-approved  by the FDA and are being commonly used.  Reconstruction can be done using the body's own muscle/fat/skin. Your caregiver will discuss your options with you. Depending upon your needs or choice, together you will be able to determine which procedure is best for you. Document Released: 11/04/2000 Document  Revised: 05/04/2011 Document Reviewed: 06/28/2007 [facility] Patient Information 2013 [place], [place]. Exercises Following Breast Surgery Following all surgeries on the breast or axillary nodes, whether you had radiation treatment or not, exercises may help you return to your normal activities and way of life sooner. Before beginning any exercise, talk to your caregiver about what type of exercises will be best for you. Your caregiver may recommend getting physical therapy to help you, especially if you do not see any progress in a month of exercising. Some light exercises can be done right after the surgery, but any drains and sutures will be removed before doing the extended or heavy exercises. Generally, the exercises will lessen problems following the surgery. You can usually expect to have full range of motion of your arm back in 4 to 6 weeks.  HOME CARE INSTRUCTIONS  These exercises should be done for the first 3 to 7 days after surgery, but only with your doctor's permission.   Use your affected arm (on the side where your surgery was) as you normally would when combing your hair, bathing, dressing and eating.  Raise your affected arm above the level of your heart for 45 minutes, 2 to 3 times a day, while lying down. Put your arm on pillows so that your hand is higher than your wrist and your elbow is a little higher than your shoulder. This will help decrease the swelling that may happen after surgery.  Exercise your affected arm while it is elevated above the level of your heart by opening and closing your hand 15 to 25 times. Then, bend and straighten your elbow. Repeat this 3 to 4 times a day. This exercise helps reduce swelling by pumping lymph fluid out of your arm.  Practice deep breathing exercises (using your diaphragm) at least 6 times each day. While lying on your back, take a slow, deep breath. Breathe in as much air as you can while trying to expand your chest and abdomen (push your  belly button away from your spine). Relax and breathe out. Repeat this 4 or 5 times. This exercise will help maintain normal movement of your chest, making it easier for your lungs to expand. Continue to do deep breathing exercises from now on.  Avoid sleeping on your affected arm or on that side.  Do not lift anything over 5 pounds.  Stop exercising if you develop pain in your chest, arm or shoulder, and call your caregiver.  Let your caregiver or therapist know if your arm becomes swollen after exercising.  Exercise in front of a mirror. This way you can see yourself exercising in a correct posture and using the correct motion that is recommended.  Do not use a heating pad on your arm of the side of the surgery. GENERAL GUIDELINES FOR EXERCISE The exercises described here can be done as soon as your doctor gives you permission. Be sure to talk to your doctor before trying any of them.   You will feel some tightness in your chest and armpit after surgery. This is normal. The tightness will decrease as you continue your exercise program.  Many women have a burning, tingling, numbness, or soreness on the back of the  arm and/or chest wall. This is because the surgery irritated some of your nerve endings. Although the sensations may increase a few weeks after surgery, continue to do the exercises unless you notice unusual swelling or tenderness. (Tell your caregiver if this occurs.) Sometimes rubbing or stroking the area with your hand or a soft cloth can help make the area less sensitive.  It may be helpful to do exercises after a warm shower when muscles are warm and relaxed.  Wear comfortable, loose clothing when doing the exercises.  Do the exercises until you feel a slow stretch. Hold each stretch at the end of the motion for a count of five. It is normal to feel some pulling as you stretch the skin and muscles that have been shortened because of the surgery. Do not do bouncing or  jerky-type movements when doing any of the exercises. You should not feel pain as you do the exercises, only gentle stretching.  Do 5 to 7 repetitions of each exercise. Try to do each exercise correctly. If you have difficulty with the exercises, contact your doctor. You may need to be referred to a physical or occupational therapist.  Exercises should be done twice a day until you regain normal flexibility and strength.  Be sure to take deep breaths, in and out, as you perform each exercise.  The exercises are designed so that you begin them lying down, move to sitting, and then finish standing. EXERCISES IN LYING POSITION These exercises should be performed on a bed or on the floor while lying on your back. Keep your knees and hips bent and feet flat.  Wand Exercise This exercise helps increase the forward motion of the shoulders. You will need a broom handle, yardstick, or other similar object to perform this exercise.   Hold the wand in both hands with palms facing up.  Lift the wand up over your head (as far as you can) using your unaffected arm to help lift the wand, until you feel a stretch in your affected arm.  Hold for five seconds.  Lower arms and repeat 5 to 7 times. Elbow Winging This exercise helps increase the mobility of the front of your chest and shoulder. It may take several weeks of regular exercise before your elbows will get close to the bed (or floor).   Clasp your hands behind your neck with your elbows pointing toward the ceiling.  Move your elbows apart and down toward the bed (or floor).  Repeat 5 to 7 times. EXERCISES IN SITTING POSITION Shoulder Blade Stretch This exercise helps increase the mobility of the shoulder blades.   Sit in a chair very close to a table.  Place the unaffected arm on the table with your elbow bent and palm down. Do not move this arm during the exercise.  Place the affected arm on the table, palm down with your elbow  straight.  Without moving your trunk, slide the affected arm toward the opposite side of the table. You should feel your shoulder blade move as you do this.  Relax your arm and repeat 5 to 7 times. Shoulder Blade Squeeze This exercise also helps increase the mobility of the shoulder blade.   Facing straight ahead, sit in a chair in front of a mirror without resting on the back of the chair.  Arms should be at your sides with elbows bent.  Squeeze shoulder blades together, bringing your elbows behind you. Keep your shoulders level as you do this exercise.  Do not lift your shoulders up toward your ears.  Return to the starting position and repeat 5 to 7 times. Side Bending This exercise helps increase the mobility of the trunk/body.   Clasp your hands together in front of you and lift your arms slowly over your head, straightening your arms.  When your arms are over your head, bend your trunk to the right while bending at the waist and keeping your arms overhead.  Return to the starting position and bend to the left.  Repeat 5 to 7 times. EXERCISES IN STANDING POSITION Chest Wall Stretch This exercise helps stretch the chest wall.   Stand facing a corner with toes approximately 8 to 10 inches from the corner.  Bend your elbows and place forearms on the wall, one on each side of the corner. Your elbows should be as close to shoulder height as possible.  Keep your arms and feet in position and move your chest toward the corner. You will feel a stretch across your chest and shoulders.  Return to starting position and repeat 5 to 7 times. Shoulder Stretch This exercise helps increase the mobility in the shoulder.   Stand facing the wall with your toes approximately 8 to 10 inches from the wall.  Place your hands on the wall. Use your fingers to "climb the wall," reaching as high as you can until you feel a stretch.  Return to starting position and repeat 5 to 7 times. THINGS TO  KEEP IN MIND  Begin exercising slowly and keep going as you are able. Stop exercising and call your caregiver if you:  Get weaker, start losing your balance or start falling.  Have pain that gets worse.  Have new heaviness in your arm.  Have unusual swelling, or swelling gets worse.  Have headaches, dizziness, blurred vision, new numbness or tingling in arms or chest. It is important to exercise to keep muscles working as well as possible, but it is also important to be safe. Talk with your caregiver about realistic exercises for your condition. Then set goals for increasing your physical activity level.  Document Released: 09/01/2005 Document Revised: 05/04/2011 Document Reviewed: 03/26/2008 [facility] Patient Information 2013 [place], [place]. Mastectomy Care After HOME CARE    Care for your wound after the bandages are off as told by your doctor.  Put soft padding such as gauze, soft cloth, or a nursing pad over your wound if you wear a bra.  Ask your doctor about groups that can help you with any emotions you may have after the surgery.  Exercise your arm and shoulder as told by your doctor.  Place your hands on a wall. Use your fingers to "climb the wall." Reach as high as you can until you feel a stretch. When you are not exercising, keep your arm raised (elevated).  When sitting or lying down, put your arm up on pillows or rolled blankets.  Do not use your arm to lift or push anything heavier than 10 pounds (about one gallon of milk ) for the first 6 weeks.  Always take good care of the arm on the side that the breast was removed.  Never let anyone take your blood pressure, draw blood, or give you a shot in that arm.  Do not get even a small cut on that arm or hand. Use a thimble when you sew. Wear heavy gloves when you garden.  Use insect repellent on that arm if outside.  Do not use a  razor to shave that underarm. You should use only an electric shaver.  Do not burn  that arm. Use a glove when you reach into the oven. Cover your arm with a towel or wear a long-sleeved shirt when you are out in the sun.  Wear your watch and other jewelry on the other arm.  Wear a loose fitting rubber glove when you wash the dishes. Do not leave your hand in water for a long time, especially when you use detergents.  Do not cut your cuticles or hang nails. Push cuticles back with a towel after you take a bath.  Carry your purse or any heavy objects in the other arm. GET HELP RIGHT AWAY IF:   Your arm becomes very puffy (swollen).  You have redness or pain at the wound site.  There is a bad smell coming from the wound.  Thre is yellowish white fluid (pus) coming from your wound.  You have a fever. MAKE SURE YOU:   Understand these instructions.  Will watch your condition.  Will get help right away if you are not doing well or get worse. Document Released: 11/19/2007 Document Revised: 05/04/2011 Document Reviewed: 11/19/2007 [facility] Patient Information 2013 [place], [place].

## 2011-12-19 ENCOUNTER — Other Ambulatory Visit: Payer: Self-pay | Admitting: Oncology

## 2011-12-19 NOTE — Progress Notes (Signed)
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 She had subsequent breast radiation. (ADDENDUM: the patient's records from [place] were received after her initial visit and that information was incorporated in the "Assessment" section below)  On 10/16/2011 the patient had routine diagnostic mammography at [facility], showing in addition to post surgical and post radiation changes in the left breast, a possible new density in the retroareolar portion of the left breast. Additional mammographic views on August 27 found no definite abnormality, but ultrasound the same day confirmed an echodense left-sided retroareolar density. The patient was then referred to the breast Center for further elevation, and biopsy of the area in question was obtained 11/19/2011. This showed ([id number] [id number]) of benign breast parenchyma with dense fibrosis. There was some fat necrosis present. There was no atypia or malignancy identified.  INTERVAL HISTORY: Peaches presented to the [facility] 12/10/2011 to establish herself in our practice.  REVIEW OF SYSTEMS: She has been anxious regarding her breast cancer situation, and also somewhat depressed. She is having hot flashes, which she describes as moderate. She has chronic back and joint pain, and generally feels she has "arthritis all over. This keeps her from walking as much as she would like. Her pain is chiefly in the feet and hands but other joints as well. It is stabbing and 18 I., intermittent, and has not increased in intensity or frequency. She has night sweats at times,  has lost a little bit of weight, sleeps poorly, describes herself is fatigued, has significant gum disease, a history of irregular heartbeat, and history of ankle swelling, shortness of breath when walking up stairs, a dry cough, poor appetite, heartburn, or occasional abdominal discomfort, and chronic headaches. She has some numbness and tingling related to her prior chemotherapy, she tells me. A detailed review of systems was otherwise noncontributory.  PAST MEDICAL HISTORY: Past Medical History  Diagnosis Date  . Cancer 2011    left breast, s/p XRT and chemo, Dr. Kerin Salen  . Fibromyalgia   . 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 Arthritis     PAST SURGICAL HISTORY: Past Surgical History  Procedure Date  . Abdominal hysterectomy 1985    one ovary remains  . Appendectomy   . Tonsillectomy   . Wisdom tooth extraction   . Mohs surgery     [place], nose  . Breast surgery     partial mastectomy, Dr. Evette Cristal    FAMILY HISTORY Family History  Problem Relation Age of Onset  . Cancer Mother     melanoma, uterine  . Kidney disease Mother   . Cancer Father     lung  . ALS Sister   . Cancer Brother     colon  . Cancer Brother     leukemia   the patient's father died from lung cancer at the age of 55. This was less than a year after his diagnosis. The patient's mother died at the age of 48 from renal failure. The patient had 4 brothers and 2 sisters. One brother was diagnosed with colon cancer at the age of 57, and survives. One brother died  June 2013 at the age of 18 from "brain cancer". He also had "blood cancer". The patient has one sister who died from ALS. There is no history of breast or ovarian cancer in the family.  GYNECOLOGIC HISTORY: Menarche age 52, she is GX P2, first live birth age 56, hysterectomy with USO in 37. She used hormone replacement for about 6 years, with no side effects that she is aware of and particularly no clotting issues.  SOCIAL HISTORY: (updated October  2013) She used to work in [employer], but is now retired. Her husband of 33 years, Ruthella Kirchman, used to work in the [employer] in [place], but is now retired. Daughter Sue Lush lives in [place] and is a homemaker. Son Fayrene Fearing lives in [place] and is disabled secondary to bipolar disease. The patient has 4 grandsons. She attends a [employer]   ADVANCED DIRECTIVES: Not in place  HEALTH MAINTENANCE: History  Substance Use Topics  . Smoking status: Former Smoker    Quit date: 12/10/2006  . Smokeless tobacco: Never Used  . Alcohol Use: No     Colonoscopy: 2009  PAP: 2009  Bone density: 2009/ "normal"  Lipid panel:  Allergies  Allergen Reactions  . Benadryl (Diphenhydramine Hcl)   . Codeine   . Contrast Media (Iodinated Diagnostic Agents)     ALL CONTRAST DYE  . Gluten   . Lidocaine     Lidocaine does NOT numb patient.  Used Nesacaine 1% (prolocaine)  . Lyrica (Pregabalin)   . Novocain (Procaine Hcl)   . Sulfa Drugs Cross Reactors   . Topamax (Topiramate)     Current Outpatient Prescriptions  Medication Sig Dispense Refill  . ALPRAZolam (XANAX) 0.5 MG tablet       . amoxicillin (AMOXIL) 500 MG tablet Take 4,000 mg before dental procedures      . aspirin 81 MG tablet Take 81 mg by mouth daily.        . citalopram (CELEXA) 20 MG tablet TAKE 1 TABLET (20 MG TOTAL) BY MOUTH DAILY.  30 tablet  3  . EPIPEN 2-PAK 0.3 MG/0.3ML DEVI Inject 0.3 mLs into the muscle as needed.      . lidocaine (LIDODERM) 5 % Place 1 patch onto the skin daily. Remove & Discard patch within 12 hours or as directed by MD as needed  30 patch  3  . Magnesium Malate POWD Take 1,250 mg by mouth 3 (three) times daily.      . metroNIDAZOLE (METROGEL) 1 % gel Apply 1 application topically daily.      . Multiple Vitamins-Minerals (MULTIVITAMIN WITH MINERALS) tablet Take 1 tablet by mouth daily.        Marland Kitchen ZOLMitriptan (ZOMIG) 2.5 MG tablet Take 2.5 mg by mouth as needed.        Marland Kitchen DISCONTD:  propranolol (INDERAL LA) 60 MG 24 hr capsule Take 1 capsule (60 mg total) by mouth daily.  30 capsule  3    OBJECTIVE: Middle-aged white woman in no acute distress There were no vitals filed for this visit.   There is no height or weight on file to calculate BMI.    ECOG FS: 0  Sclerae unicteric Oropharynx clear No cervical or supraclavicular adenopathy Lungs no rales or rhonchi Heart regular rate and rhythm Abd benign MSK no focal spinal tenderness, no peripheral edema Neuro: nonfocal Breasts: The left breast is status post prior lumpectomy and radiation. There is no evidence of local recurrence. The left axilla is clear.  The right breast is unremarkable   LAB RESULTS: Lab Results  Component Value Date   WBC 7.4 12/08/2011   NEUTROABS 4.3 12/08/2011   HGB 12.9 12/08/2011   HCT 38.5 12/08/2011   MCV 98.3 12/08/2011   PLT 254 12/08/2011      Chemistry      Component Value Date/Time   NA 138 12/08/2011 1612   NA 139 10/16/2011 1622   NA 139 06/10/2011 1044   K 3.7 12/08/2011 1612   K 4.1 10/16/2011 1622   CL 104 12/08/2011 1612   CL 99 10/16/2011 1622   CO2 24 12/08/2011 1612   CO2 26 10/16/2011 1622   BUN 13.0 12/08/2011 1612   BUN 15 10/16/2011 1622   BUN 12 06/10/2011 1044   CREATININE 0.8 12/08/2011 1612   CREATININE 0.66 10/16/2011 1622      Component Value Date/Time   CALCIUM 10.2 12/08/2011 1612   CALCIUM 9.4 10/16/2011 1622   ALKPHOS 77 12/08/2011 1612   ALKPHOS 101 10/16/2011 1622   AST 28 12/08/2011 1612   AST 24 10/16/2011 1622   ALT 30 12/08/2011 1612   ALT 20 10/16/2011 1622   BILITOT 0.20 12/08/2011 1612   BILITOT 0.2 10/16/2011 1622       No results found for this basename: LABCA2    No components found with this basename: LABCA125    No results found for this basename: INR:1;PROTIME:1 in the last 168 hours  Urinalysis No results found for this basename: colorurine,  appearanceur,  labspec,  phurine,  glucoseu,  hgbur,  bilirubinur,  ketonesur,   proteinur,  urobilinogen,  nitrite,  leukocytesur    STUDIES: [place] Core Biopsy  12/10/2011  **ADDENDUM** CREATED: 11/20/2011 11:58:47  Pathology revealed benign breast parenchyma with dense fibrosis, hemosiderin laden macrophages, and fat necrosis in the left breast. This was found to be concordant by Dr. Norva Pavlov. Pathology was relayed by telephone. The patient reported doing well after the biopsy. Post biopsy instructions were reviewed and her questions were answered. She was encouraged to call [facility] for any additional concerns. She was asked to return in August of 2014 for bilateral diagnostic mammography.  Pathology results are dictated by Sonnie Alamo RN, BSN on November 20, 2011.  **END ADDENDUM** SIGNED BY: Sherian Rein, M.D.   12/10/2011  **ADDENDUM** CREATED: 11/20/2011 11:58:47  Pathology revealed benign breast parenchyma with dense fibrosis, hemosiderin laden macrophages, and fat necrosis in the left breast. This was found to be concordant by Dr. Norva Pavlov. Pathology was relayed by telephone. The patient reported doing well after the biopsy. Post biopsy instructions were reviewed and her questions were answered. She was encouraged to call [facility] for any additional concerns. She was asked to return in August of 2014 for bilateral diagnostic mammography.  Pathology results are dictated by Sonnie Alamo RN, BSN on November 20, 2011.  **END ADDENDUM** SIGNED BY: Sherian Rein, M.D.   11/30/2011  **ADDENDUM** CREATED: 11/20/2011 11:58:47  Pathology revealed benign breast parenchyma with dense fibrosis, hemosiderin laden macrophages, and fat necrosis in the left breast. This was found to be concordant by Dr. Norva Pavlov. Pathology was relayed by telephone. The patient reported doing well after the biopsy. Post biopsy instructions were reviewed and her questions were answered. She was encouraged to call [facility] for any additional concerns. She was asked to return in August of 2014 for bilateral diagnostic mammography.  Pathology  results are dictated by Sonnie Alamo RN, BSN on November 20, 2011.  **END ADDENDUM** SIGNED BY: Sherian Rein, M.D.   11/19/2011  *RADIOLOGY REPORT*  Clinical Data:  Hypoechoic area and postsurgical bed left breast  ULTRASOUND GUIDED CORE BIOPSY OF THE left BREAST  Comparison: Previous exams.  I met with the patient and we discussed the procedure of ultrasound- guided biopsy, including benefits and alternatives.  We discussed the high likelihood of a successful procedure. We discussed the risks of the procedure, including infection, bleeding, tissue injury, clip migration, and inadequate sampling.  Informed written consent was given.  Using sterile technique Nesacaine, ultrasound guidance and a 14 gauge automated biopsy device, biopsy was performed of hypoechoic area retroareolar left breast. No biopsy clip was placed  IMPRESSION: Ultrasound guided biopsy of left breast.  No apparent complications.  Original Report Authenticated By: Sherian Rein, M.D.     ASSESSMENT: 62 y.o. [place] woman   (1) s/p left lumpectomy and sentinel lymph node dissection October 22, 2009 for s 3.7 cm invasive ductal carcinoma grade 3, with 0 of 3 sentinel lymph nodes involved, estrogen and progesterone receptor negative, HER-2 2+ by immunohistochenmistry but FISH negative, with an initially positive margin cleared by additional resection November 19, 2009  (2) completed 4 cycles of AC with dose reductions due to neutropenic fever and colitis;  (3) received weekly taxol x 6 with multiple dose reductions and delays due to neuropathy and other symptoms; last chemotherapy dose 05/01/2010  (4) radiation to the left breast completed 08/13/2010  (4) benigh left breast biopsy 11/19/2011  PLAN: Marylinda is appropriately concerned about the possibility of recurrence. She has received optimal  chemotherapy, and was not able to tolerate standard doses of AC or a full course of Taxol. This means if she had a local recurrence her treatment options would be limited. Her chance of cure would be compromised. For this reason she is very interested in proceeding to bilateral mastectomies, and in her situation I think that is a reasonable choice. She would like to have immediate reconstruction, and all that will be operationalized through Dr. Luisa Hart.  Of course she just had a big scare regarding her left breast biopsy. Luckily that was benign. I frequently restage my patients at 2 years from the original surgery, because the first 2 years at our the time of highest risk of recurrence, particularly in the case of triple negative patients. Accordingly I would set her up for a CT scan of the chest and PET scan, but she is checking with her insurance company to see if this would be covered.  Otherwise she will return to see me in approximately 3 months. She knows to call for any problems that may develop before that visit.   Arnie Maiolo C    12/19/2011

## 2011-12-22 ENCOUNTER — Encounter (HOSPITAL_COMMUNITY)
Admission: RE | Admit: 2011-12-22 | Discharge: 2011-12-22 | Disposition: A | Discharge status: Home / Self Care | Payer: BC Managed Care – PPO | Source: Ambulatory Visit | Attending: Oncology | Admitting: Oncology

## 2011-12-22 ENCOUNTER — Ambulatory Visit (HOSPITAL_COMMUNITY)
Admission: RE | Admit: 2011-12-22 | Discharge: 2011-12-22 | Disposition: A | Discharge status: Home / Self Care | Payer: BC Managed Care – PPO | Source: Ambulatory Visit | Attending: Oncology | Admitting: Oncology

## 2011-12-22 ENCOUNTER — Other Ambulatory Visit: Payer: Self-pay | Admitting: Oncology

## 2011-12-22 DIAGNOSIS — C50919 Malignant neoplasm of unspecified site of unspecified female breast: Secondary | ICD-10-CM

## 2011-12-22 DIAGNOSIS — Z9221 Personal history of antineoplastic chemotherapy: Secondary | ICD-10-CM | POA: Insufficient documentation

## 2011-12-22 DIAGNOSIS — Z79899 Other long term (current) drug therapy: Secondary | ICD-10-CM | POA: Insufficient documentation

## 2011-12-22 DIAGNOSIS — R918 Other nonspecific abnormal finding of lung field: Secondary | ICD-10-CM | POA: Insufficient documentation

## 2011-12-22 DIAGNOSIS — Z923 Personal history of irradiation: Secondary | ICD-10-CM | POA: Insufficient documentation

## 2011-12-22 LAB — GLUCOSE, CAPILLARY: Glucose-Capillary: 97 mg/dL (ref 70–99)

## 2011-12-22 MED ORDER — IOHEXOL 300 MG/ML  SOLN
80.0000 mL | Freq: Once | INTRAMUSCULAR | Status: AC | PRN
  Administered 2011-12-22: 80 mL via INTRAVENOUS

## 2011-12-22 MED ORDER — FLUDEOXYGLUCOSE F - 18 (FDG) INJECTION
16.5000 | Freq: Once | INTRAVENOUS | Status: AC | PRN
  Administered 2011-12-22: 16.5 via INTRAVENOUS

## 2011-12-23 ENCOUNTER — Telehealth: Payer: Self-pay | Admitting: Medical Oncology

## 2011-12-23 ENCOUNTER — Other Ambulatory Visit (INDEPENDENT_AMBULATORY_CARE_PROVIDER_SITE_OTHER): Payer: Self-pay | Admitting: Surgery

## 2011-12-23 NOTE — Telephone Encounter (Signed)
Notified patient per MD, no evidence of breast cancer on recent PET/CT scan.  Patient expressed understanding, reminded patient of appointments in January with Dr. Darnelle Catalan.  Instructed patient to call with any questions or concerns.

## 2011-12-28 ENCOUNTER — Telehealth: Payer: Self-pay | Admitting: Internal Medicine

## 2011-12-28 ENCOUNTER — Emergency Department (HOSPITAL_COMMUNITY): Payer: BC Managed Care – PPO

## 2011-12-28 ENCOUNTER — Encounter: Payer: Self-pay | Admitting: Internal Medicine

## 2011-12-28 ENCOUNTER — Observation Stay (HOSPITAL_COMMUNITY)
Admission: EM | Admit: 2011-12-28 | Discharge: 2012-01-01 | Disposition: A | Discharge status: Home / Self Care | Payer: BC Managed Care – PPO | Attending: Internal Medicine | Admitting: Internal Medicine

## 2011-12-28 ENCOUNTER — Encounter (HOSPITAL_COMMUNITY): Payer: Self-pay | Admitting: *Deleted

## 2011-12-28 DIAGNOSIS — I10 Essential (primary) hypertension: Secondary | ICD-10-CM | POA: Insufficient documentation

## 2011-12-28 DIAGNOSIS — M797 Fibromyalgia: Secondary | ICD-10-CM

## 2011-12-28 DIAGNOSIS — R0602 Shortness of breath: Secondary | ICD-10-CM | POA: Insufficient documentation

## 2011-12-28 DIAGNOSIS — K2970 Gastritis, unspecified, without bleeding: Principal | ICD-10-CM | POA: Insufficient documentation

## 2011-12-28 DIAGNOSIS — M542 Cervicalgia: Secondary | ICD-10-CM | POA: Diagnosis present

## 2011-12-28 DIAGNOSIS — R112 Nausea with vomiting, unspecified: Secondary | ICD-10-CM | POA: Insufficient documentation

## 2011-12-28 DIAGNOSIS — R079 Chest pain, unspecified: Secondary | ICD-10-CM | POA: Insufficient documentation

## 2011-12-28 DIAGNOSIS — R1013 Epigastric pain: Secondary | ICD-10-CM | POA: Diagnosis present

## 2011-12-28 DIAGNOSIS — K2990 Gastroduodenitis, unspecified, without bleeding: Secondary | ICD-10-CM

## 2011-12-28 DIAGNOSIS — Z853 Personal history of malignant neoplasm of breast: Secondary | ICD-10-CM

## 2011-12-28 DIAGNOSIS — F418 Other specified anxiety disorders: Secondary | ICD-10-CM | POA: Diagnosis present

## 2011-12-28 DIAGNOSIS — R197 Diarrhea, unspecified: Secondary | ICD-10-CM | POA: Insufficient documentation

## 2011-12-28 DIAGNOSIS — IMO0001 Reserved for inherently not codable concepts without codable children: Secondary | ICD-10-CM | POA: Insufficient documentation

## 2011-12-28 DIAGNOSIS — C50919 Malignant neoplasm of unspecified site of unspecified female breast: Secondary | ICD-10-CM

## 2011-12-28 HISTORY — DX: Essential (primary) hypertension: I10

## 2011-12-28 LAB — HEPATIC FUNCTION PANEL
ALT: 22 U/L (ref 0–35)
AST: 27 U/L (ref 0–37)
Albumin: 4.2 g/dL (ref 3.5–5.2)
Alkaline Phosphatase: 75 U/L (ref 39–117)
Total Protein: 7.8 g/dL (ref 6.0–8.3)

## 2011-12-28 LAB — CBC
HCT: 39 % (ref 36.0–46.0)
MCV: 96.8 fL (ref 78.0–100.0)
Platelets: 238 10*3/uL (ref 150–400)
RBC: 4.03 MIL/uL (ref 3.87–5.11)
WBC: 6.6 10*3/uL (ref 4.0–10.5)

## 2011-12-28 LAB — BASIC METABOLIC PANEL
CO2: 27 mEq/L (ref 19–32)
Chloride: 104 mEq/L (ref 96–112)
Creatinine, Ser: 0.7 mg/dL (ref 0.50–1.10)
Potassium: 4.4 mEq/L (ref 3.5–5.1)

## 2011-12-28 LAB — URINALYSIS, ROUTINE W REFLEX MICROSCOPIC
Bilirubin Urine: NEGATIVE
Glucose, UA: NEGATIVE mg/dL
Specific Gravity, Urine: 1.01 (ref 1.005–1.030)
pH: 5 (ref 5.0–8.0)

## 2011-12-28 LAB — URINE MICROSCOPIC-ADD ON

## 2011-12-28 LAB — POCT I-STAT TROPONIN I: Troponin i, poc: 0.01 ng/mL (ref 0.00–0.08)

## 2011-12-28 MED ORDER — HYDROMORPHONE HCL PF 1 MG/ML IJ SOLN
1.0000 mg | Freq: Once | INTRAMUSCULAR | Status: AC
  Administered 2011-12-28: 1 mg via INTRAVENOUS
  Filled 2011-12-28 (×2): qty 1

## 2011-12-28 MED ORDER — HYDROMORPHONE HCL PF 1 MG/ML IJ SOLN
1.0000 mg | Freq: Once | INTRAMUSCULAR | Status: AC
  Administered 2011-12-28: 1 mg via INTRAVENOUS
  Filled 2011-12-28: qty 1

## 2011-12-28 MED ORDER — SODIUM CHLORIDE 0.9 % IV BOLUS (SEPSIS)
1000.0000 mL | Freq: Once | INTRAVENOUS | Status: AC
  Administered 2011-12-28: 1000 mL via INTRAVENOUS

## 2011-12-28 MED ORDER — PROMETHAZINE HCL 25 MG/ML IJ SOLN
12.5000 mg | Freq: Once | INTRAMUSCULAR | Status: AC
  Administered 2011-12-28: 12.5 mg via INTRAVENOUS
  Filled 2011-12-28: qty 1

## 2011-12-28 MED ORDER — IOHEXOL 300 MG/ML  SOLN
100.0000 mL | Freq: Once | INTRAMUSCULAR | Status: AC | PRN
  Administered 2011-12-28: 100 mL via INTRAVENOUS

## 2011-12-28 MED ORDER — ONDANSETRON HCL 4 MG/2ML IJ SOLN
4.0000 mg | Freq: Once | INTRAMUSCULAR | Status: AC
  Administered 2011-12-28: 4 mg via INTRAVENOUS
  Filled 2011-12-28: qty 2

## 2011-12-28 MED ORDER — GI COCKTAIL ~~LOC~~
30.0000 mL | Freq: Once | ORAL | Status: AC
  Administered 2011-12-28: 30 mL via ORAL
  Filled 2011-12-28: qty 30

## 2011-12-28 MED ORDER — HYDROMORPHONE HCL PF 1 MG/ML IJ SOLN
1.0000 mg | INTRAMUSCULAR | Status: AC
  Administered 2011-12-28: 1 mg via INTRAVENOUS

## 2011-12-28 NOTE — ED Notes (Signed)
US completed

## 2011-12-28 NOTE — Telephone Encounter (Signed)
Agree. Needs to go to ED for evaluation.

## 2011-12-28 NOTE — H&P (Addendum)
PCP:   Shelia Media, MD   Chief Complaint:  Epigastric pain  HPI: This is a 61 year old female with history of breast cancer in possible remission. She presents today with complaints of epigastric pain that radiates to her back. She states the pain started this morning and has gotten progressively worse. She's had some nausea but no vomiting. She had 5 episodes of diarrhea times this morning, no evidence of bleeding. Pain described as continuous is squeezing, at its worst was 9/10 currently the ER to 3/10. She states she's never had this discomfort before. The patient had a recent episode of "tic fever" for which she was treated with multiple antibiotics including doxycycline. That was approximately 2 months ago. She states her stomach has not been right since. She's been taking Tagamet. She occasionally takes Naprosyn for pain as needed. Patient denies chest pains. She did receive a GI cocktail in the ER, she states it did not help her epigastric discomfort. She does have lidocaine on her allergy list. She states is none allergy but that usually  lidocaine usually does not work for her.  The patient has multiple medical complaints, some are chronic related to her chemotherapy from 2 years ago. She has severe peripheral neuropathy in the hands and feet. She is urinary retention which is getting progressively worse. She has been told that is probably neurogenic bladder. She's had recurrent swelling and pain in the neck since chemotherapy. She has fibromyalgia. She may need a mastectomy as there may be a question of recurrence and patient is not a candidate for chemotherapy given the severity of the neuropathy and other side effects from chemotherapy. History provided by the patient and her husband was at the bedside.  Review of Systems:  The patient denies anorexia, fever, weight loss,, vision loss, decreased hearing, hoarseness, chest pain, syncope, dyspnea on exertion, peripheral edema, balance  deficits, hemoptysis, melena, hematochezia, severe indigestion/heartburn, hematuria, incontinence, genital sores, muscle weakness, suspicious skin lesions, transient blindness, difficulty walking, depression, unusual weight change, abnormal bleeding, enlarged lymph nodes, angioedema, and breast masses.  Past Medical History: Past Medical History  Diagnosis Date  . Cancer 2011    left breast, s/p XRT and chemo, Dr. Kerin Salen  . Fibromyalgia   . 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 Arthritis   . Hypertension    Past Surgical History  Procedure Date  . Abdominal hysterectomy 1985    one ovary remains  . Appendectomy   . Tonsillectomy   . Wisdom tooth extraction   . Mohs surgery     [place], nose  . Breast surgery     partial mastectomy, Dr. Evette Cristal    Medications: Prior to Admission medications   Medication Sig Start Date End Date Taking? Authorizing Provider  ALPRAZolam Prudy Feeler) 0.5 MG tablet Take 0.5 mg by mouth at bedtime.  10/27/11  Yes Historical Provider, MD  amoxicillin (AMOXIL) 500 MG capsule Take 2,000 mg by mouth once. For dental procedure   Yes Historical Provider, MD  Ascorbic Acid (VITAMIN C PO) Take 1 tablet by mouth daily.   Yes Historical Provider, MD  aspirin 81 MG tablet Take 81 mg by mouth daily.     Yes Historical Provider, MD  BIOTIN PO Take 1 tablet by mouth daily.   Yes Historical Provider, MD  citalopram (CELEXA) 20 MG tablet TAKE 1 TABLET (20 MG TOTAL) BY MOUTH DAILY. 11/08/11  Yes Shelia Media, MD  diclofenac sodium (VOLTAREN) 1 % GEL Apply 1 g topically 4 (  four) times daily as needed. To joints as needed for arthritis pain   Yes Historical Provider, MD  EPIPEN 2-PAK 0.3 MG/0.3ML DEVI Inject 0.3 mLs into the muscle as needed. 12/13/10  Yes Historical Provider, MD  hydroxypropyl methylcellulose (ISOPTO TEARS) 2.5 % ophthalmic solution Place 1 drop into both eyes 4 (four) times daily as needed. Dry eyes   Yes Historical Provider, MD  lidocaine (LIDODERM) 5 % Place 1  patch onto the skin daily as needed. Arthritis pain to joints. Remove & Discard patch within 12 hours or as directed by MD   Yes Historical Provider, MD  MAGNESIUM SALICYLATE PO Take 1 tablet by mouth daily.   Yes Historical Provider, MD  metroNIDAZOLE (METROGEL) 1 % gel Apply 1 application topically every morning. To face for roscea   Yes Historical Provider, MD  Multiple Vitamin (MULTIVITAMIN WITH MINERALS) TABS Take 1 tablet by mouth daily.   Yes Historical Provider, MD  naproxen sodium (ANAPROX) 220 MG tablet Take 220 mg by mouth 2 (two) times daily as needed. pain   Yes Historical Provider, MD  OVER THE COUNTER MEDICATION Place 1 Squirt into the nose daily as needed. Dry nose   Yes Historical Provider, MD  ZOLMitriptan (ZOMIG) 2.5 MG tablet Take 2.5 mg by mouth daily as needed. migraine   Yes Historical Provider, MD    Allergies:   Allergies  Allergen Reactions  . Benadryl (Diphenhydramine Hcl)     Stops kidneys  . Lyrica (Pregabalin) Shortness Of Breath  . Contrast Media (Iodinated Diagnostic Agents)     ALLERGIC TO GADOLINIUM  . Gluten   . Lidocaine     Lidocaine does NOT numb patient.  Used Nesacaine 1% (prolocaine)  . Novocain (Procaine Hcl) Other (See Comments)    Does not work  . Sulfa Drugs Cross Reactors     Doesn't remember  . Codeine Rash  . Topamax (Topiramate) Hives and Rash    Social History:  reports that she quit smoking about 5 years ago. She has never used smokeless tobacco. She reports that she does not drink alcohol or use illicit drugs.  Family History: Family History  Problem Relation Age of Onset  . Cancer Mother     melanoma, uterine  . Kidney disease Mother   . Cancer Father     lung  . ALS Sister   . Cancer Brother     colon  . Cancer Brother     leukemia    Physical Exam: Filed Vitals:   12/28/11 1141 12/28/11 1949 12/28/11 1950 12/28/11 2227  BP: 158/62 121/60 121/60 118/53  Pulse: 81 96  92  Temp: 97.8 F (36.6 C)     TempSrc:  Oral     Resp: 18 16 16 7   SpO2: 99% 97% 96% 78%    General:  Alert and oriented times three, well developed and nourished,  uncomfortable appearing female Eyes: PERRLA, pink conjunctiva, no scleral icterus ENT: Moist oral mucosa, neck supple, no thyromegaly Lungs: clear to ascultation, no wheeze, no crackles, no use of accessory muscles Cardiovascular: regular rate and rhythm, no regurgitation, no gallops, no murmurs. No carotid bruits, no JVD Abdomen: soft, positive BS, Tenderness to palpation generalized in an in epigastric region (patient recent received Dilaudid) , non-distended, no organomegaly, not an acute abdomen GU: not examined Neuro: CN II - XII grossly intact, sensation intact Musculoskeletal: strength 5/5 all extremities, no clubbing, cyanosis or edema Skin: no rash, no subcutaneous crepitation, no decubitus Psych: appropriate patient  Labs on Admission:   [facility] 12/28/11 1246  NA 140  K 4.4  CL 104  CO2 27  GLUCOSE 98  BUN 10  CREATININE 0.70  CALCIUM 9.8  MG --  PHOS --    Basename 12/28/11 1246  AST 27  ALT 22  ALKPHOS 75  BILITOT 0.2*  PROT 7.8  ALBUMIN 4.2    Basename 12/28/11 1246  LIPASE 40  AMYLASE --    Basename 12/28/11 1246  WBC 6.6  NEUTROABS --  HGB 13.3  HCT 39.0  MCV 96.8  PLT 238   No results found for this basename: CKTOTAL:3,CKMB:3,CKMBINDEX:3,TROPONINI:3 in the last 72 hours No components found with this basename: POCBNP:3 No results found for this basename: DDIMER:2 in the last 72 hours No results found for this basename: HGBA1C:2 in the last 72 hours No results found for this basename: CHOL:2,HDL:2,LDLCALC:2,TRIG:2,CHOLHDL:2,LDLDIRECT:2 in the last 72 hours No results found for this basename: TSH,T4TOTAL,FREET3,T3FREE,THYROIDAB in the last 72 hours No results found for this basename: VITAMINB12:2,FOLATE:2,FERRITIN:2,TIBC:2,IRON:2,RETICCTPCT:2 in the last 72 hours  Micro Results: No results found for this or any  previous visit (from the past 240 hour(s)). Results for GENAY, DANTONI (MRN [id number]) as of 12/28/2011 23:55  Ref. Range 12/28/2011 21:23  Color, Urine Latest Range: YELLOW  YELLOW  APPearance Latest Range: CLEAR  CLEAR  Specific Gravity, Urine Latest Range: 1.005-1.030  1.010  pH Latest Range: 5.0-8.0  5.0  Glucose Latest Range: NEGATIVE mg/dL NEGATIVE  Bilirubin Urine Latest Range: NEGATIVE  NEGATIVE  Ketones, ur Latest Range: NEGATIVE mg/dL NEGATIVE  Protein Latest Range: NEGATIVE mg/dL NEGATIVE  Urobilinogen, UA Latest Range: 0.0-1.0 mg/dL 0.2  Nitrite Latest Range: NEGATIVE  NEGATIVE  Leukocytes, UA Latest Range: NEGATIVE  NEGATIVE  Hgb urine dipstick Latest Range: NEGATIVE  TRACE (A)  RBC / HPF Latest Range: <3 RBC/hpf 0-2  Squamous Epithelial / LPF Latest Range: RARE  RARE  Bacteria, UA Latest Range: RARE  RARE    Radiological Exams on Admission: Dg Chest 2 View  12/28/2011  *RADIOLOGY REPORT*  Clinical Data: Chest pain, shortness of breath.  CHEST - 2 VIEW  Comparison: 12/22/2011 CT  Findings: Lungs are clear. No pleural effusion or pneumothorax. The cardiomediastinal contours are within normal limits. The visualized bones and soft tissues are without significant appreciable abnormality.  IMPRESSION: No radiographic evidence of acute cardiopulmonary process.   Original Report Authenticated By: Jearld Lesch, M.D.    US Abdomen Complete  12/28/2011  *RADIOLOGY REPORT*  Clinical Data:  Epigastric and right upper abdominal pain.  ABDOMEN ULTRASOUND  Technique:  Complete abdominal ultrasound examination was performed including evaluation of the liver, gallbladder, bile ducts, pancreas, kidneys, spleen, IVC, and abdominal aorta.  Comparison:  None  Findings:  Gallbladder:  No shadowing gallstones or echogenic sludge.  No gallbladder wall thickening or pericholecystic fluid.  Negative sonographic Murphy's sign according to the ultrasound technologist.  Common Bile Duct:  Normal caliber  of 5 mm.  Liver:  The liver demonstrates heterogeneous echotexture and generalized increase echogenicity suggestive of steatosis.  No focal masses are identified by ultrasound in the liver.  IVC:  Patent throughout its visualized course in the abdomen.  Pancreas:  Although the pancreas is difficult to visualize in its entirety, no focal pancreatic abnormality is identified.  Spleen:  The spleen shows normal echotexture and size.  Kidneys:  The right kidney measures 10.5 cm and the left kidney also 10.5 cm in estimated length.  Both have normal sonographic appearance without evidence of  hydronephrosis or focal lesion.  Abdominal Aorta:  Normal caliber abdominal aorta.  IMPRESSION: Heterogeneous and increased echotexture of the liver suggestive of hepatic steatosis.   Original Report Authenticated By: Irish Lack, M.D.    Ct Abdomen Pelvis W Contrast  12/28/2011  *RADIOLOGY REPORT*  Clinical Data: Midsternal and upper abdominal pain.  CT ABDOMEN AND PELVIS WITH CONTRAST  Technique:  Multidetector CT imaging of the abdomen and pelvis was performed following the standard protocol during bolus administration of intravenous contrast.  Contrast: OMNIPAQUE IOHEXOL 300 MG/ML  SOLN  Comparison: Multiple exams, including 12/22/2011  Findings: Dependent subsegmental atelectasis noted in the lower lobes.  Contrast medium in the distal esophagus probably reflects gastroesophageal reflux. Small hiatal hernia.  Hepatic steatosis noted.  No specific worrisome focal liver lesion. The spleen, adrenal glands, and pancreas appear unremarkable. The gallbladder and biliary system appear unremarkable.  The stomach is moderately distended with contrast medium which extends into the duodenal bulb and proximal small bowel, but which does not extend to the distal small bowel.  Timing of the scan relative to oral contrast administration is unknown.  The kidneys appear unremarkable, as do the proximal ureters.  No pathologic  retroperitoneal or porta hepatis adenopathy is identified.  Aortoiliac atherosclerotic calcification noted.  Terminal ileum unremarkable.  Urinary bladder normal.  Uterus absent.  No ascites noted. No pathologic pelvic adenopathy is identified.  Degenerative disc disease noted at the L4-5 and L5-S1 levels, with suspected foraminal impingement on the left at L5-S1 primarily from facet arthropathy.  IMPRESSION:  1.  Gastroesophageal reflux with small hiatal hernia. 2.  Hepatic steatosis. 3.  Suspected mild left foraminal impingement at L5-S1. 4.  Dependent subsegmental atelectasis in the lower lobes of the lungs. 5.  Atherosclerosis.   Original Report Authenticated By: Gaylyn Rong, M.D.     Assessment/Plan Present on Admission:  . Epigastric abdominal pain Unclear etiology, may be an ulcer  Bring in for observation, clear liquid diet, IV Protonix  Pain medication as needed  Defer to a.m. team also consulted GI for EGD. If discomfort persists we'll consider CT chest with contrast in a.m. unable to current this patient are the head CT abdomen and contrast. Patient's his hemodynamically stable . Fibromyalgia . Depression with anxiety . Neck pain  breast cancer  stable resume home medications   Focal SCD's for DVT prophylaxis   Trevel Dillenbeck 12/28/2011, 11:46 PM

## 2011-12-28 NOTE — ED Provider Notes (Signed)
2:13 PM Patient to move to CDU holding for further evaluation of epigastric pain.  Sign out received from Dr Karma Ganja.  Patient with gnawing epigastric pain that radiates to her back.  Concern for pancreatitis vs gallbladder pathology.  EKG and CXR done to r/o concern of chest pain.  Low clinical concern for PE.  Plan is for US abdomen, labs, pain and nausea medication.  If [place] is negative, will consider abdominal CT.  If results are all unremarkable and patient's symptoms are improved, she may be discharged home.    5:31 PM Pt has not yet moved to CDU.  However, I have seen her in her room on Pod B/D.  Pt reports return of her pain, states the pain is spreading - located in her upper abdomen L>R, radiates through to her back.    8:07 PM Discussed results with patient.  Pt reports continued pain, states she has never had pain like this before.  Will give GI cocktail and recheck.  Given patient's level of pain and lack of improvement, will consider admission for overnight observation.    9:58 PM GI cocktail with little effect.  Pt with continued pain in abdomen.  Epigastric area very tender to palpation without guarding or rebound.  Pt admitted to [facility] for observation.  Hospitalist to see pt in CDU, place her own holding orders and bed request.   Results for orders placed during the hospital encounter of 12/28/11  CBC      Component Value Range   WBC 6.6  4.0 - 10.5 K/uL   RBC 4.03  3.87 - 5.11 MIL/uL   Hemoglobin 13.3  12.0 - 15.0 g/dL   HCT 41.3  24.4 - 01.0 %   MCV 96.8  78.0 - 100.0 fL   MCH 33.0  26.0 - 34.0 pg   MCHC 34.1  30.0 - 36.0 g/dL   RDW 27.2  53.6 - 64.4 %   Platelets 238  150 - 400 K/uL  BASIC METABOLIC PANEL      Component Value Range   Sodium 140  135 - 145 mEq/L   Potassium 4.4  3.5 - 5.1 mEq/L   Chloride 104  96 - 112 mEq/L   CO2 27  19 - 32 mEq/L   Glucose, Bld 98  70 - 99 mg/dL   BUN 10  6 - 23 mg/dL   Creatinine, Ser 0.34  0.50 - 1.10 mg/dL   Calcium 9.8   8.4 - 74.2 mg/dL   GFR calc non Af Amer >90  >90 mL/min   GFR calc Af Amer >90  >90 mL/min  HEPATIC FUNCTION PANEL      Component Value Range   Total Protein 7.8  6.0 - 8.3 g/dL   Albumin 4.2  3.5 - 5.2 g/dL   AST 27  0 - 37 U/L   ALT 22  0 - 35 U/L   Alkaline Phosphatase 75  39 - 117 U/L   Total Bilirubin 0.2 (*) 0.3 - 1.2 mg/dL   Bilirubin, Direct <5.9  0.0 - 0.3 mg/dL   Indirect Bilirubin NOT CALCULATED  0.3 - 0.9 mg/dL  LIPASE, BLOOD      Component Value Range   Lipase 40  11 - 59 U/L  URINALYSIS, ROUTINE W REFLEX MICROSCOPIC      Component Value Range   Color, Urine YELLOW  YELLOW   APPearance CLEAR  CLEAR   Specific Gravity, Urine 1.010  1.005 - 1.030   pH 5.0  5.0 - 8.0   Glucose, UA NEGATIVE  NEGATIVE mg/dL   Hgb urine dipstick TRACE (*) NEGATIVE   Bilirubin Urine NEGATIVE  NEGATIVE   Ketones, ur NEGATIVE  NEGATIVE mg/dL   Protein, ur NEGATIVE  NEGATIVE mg/dL   Urobilinogen, UA 0.2  0.0 - 1.0 mg/dL   Nitrite NEGATIVE  NEGATIVE   Leukocytes, UA NEGATIVE  NEGATIVE  POCT I-STAT TROPONIN I      Component Value Range   Troponin i, poc 0.01  0.00 - 0.08 ng/mL   Comment 3           URINE MICROSCOPIC-ADD ON      Component Value Range   Squamous Epithelial / LPF RARE  RARE   RBC / HPF 0-2  <3 RBC/hpf   Bacteria, UA RARE  RARE   Dg Chest 2 View  12/28/2011  *RADIOLOGY REPORT*  Clinical Data: Chest pain, shortness of breath.  CHEST - 2 VIEW  Comparison: 12/22/2011 CT  Findings: Lungs are clear. No pleural effusion or pneumothorax. The cardiomediastinal contours are within normal limits. The visualized bones and soft tissues are without significant appreciable abnormality.  IMPRESSION: No radiographic evidence of acute cardiopulmonary process.   Original Report Authenticated By: Jearld Lesch, M.D.    Ct Chest W Contrast  12/22/2011  *RADIOLOGY REPORT*  Clinical Data: Left breast cancer diagnosed 2011.  Chemotherapy and radiation therapy complete.  CT CHEST WITH CONTRAST   Technique:  Multidetector CT imaging of the chest was performed following the standard protocol during bolus administration of intravenous contrast.  Contrast: 80mL OMNIPAQUE IOHEXOL 300 MG/ML  SOLN  Comparison: PET CT scan 12/22/2011  Findings: No axillary or supraclavicular lymphadenopathy.  No internal mammary adenopathy.  The 10 mm mixed density lesion within the left lobe of the thyroid gland. This is likely benign as it is not hypermetabolic on comparison PET CT scan.  No mediastinal or hilar lymphadenopathy.  No pericardial fluid.  Review of the lung parenchyma demonstrates two subpleural nodules in the posterior aspect of the right lower lobe (image 30 and 32). These appear benign.  There is mild peripheral nodular pleural thickening in the left upper lobe likely related to radiation therapy.  Within the most inferior aspect of the left lower lobe there is a small 4 mm pulmonary nodule (image 48).  Airways are normal.  IMPRESSION:  1.  No evidence of breast cancer recurrence. 2.  Small pulmonary nodules are likely benign.  Recommend additional follow-up. 3.  Mild postradiation pleural change in the left upper lobe.   Original Report Authenticated By: Genevive Bi, M.D.    US Abdomen Complete  12/28/2011  *RADIOLOGY REPORT*  Clinical Data:  Epigastric and right upper abdominal pain.  ABDOMEN ULTRASOUND  Technique:  Complete abdominal ultrasound examination was performed including evaluation of the liver, gallbladder, bile ducts, pancreas, kidneys, spleen, IVC, and abdominal aorta.  Comparison:  None  Findings:  Gallbladder:  No shadowing gallstones or echogenic sludge.  No gallbladder wall thickening or pericholecystic fluid.  Negative sonographic Murphy's sign according to the ultrasound technologist.  Common Bile Duct:  Normal caliber of 5 mm.  Liver:  The liver demonstrates heterogeneous echotexture and generalized increase echogenicity suggestive of steatosis.  No focal masses are identified by  ultrasound in the liver.  IVC:  Patent throughout its visualized course in the abdomen.  Pancreas:  Although the pancreas is difficult to visualize in its entirety, no focal pancreatic abnormality is identified.  Spleen:  The spleen shows normal  echotexture and size.  Kidneys:  The right kidney measures 10.5 cm and the left kidney also 10.5 cm in estimated length.  Both have normal sonographic appearance without evidence of hydronephrosis or focal lesion.  Abdominal Aorta:  Normal caliber abdominal aorta.  IMPRESSION: Heterogeneous and increased echotexture of the liver suggestive of hepatic steatosis.   Original Report Authenticated By: Irish Lack, M.D.    Ct Abdomen Pelvis W Contrast  12/28/2011  *RADIOLOGY REPORT*  Clinical Data: Midsternal and upper abdominal pain.  CT ABDOMEN AND PELVIS WITH CONTRAST  Technique:  Multidetector CT imaging of the abdomen and pelvis was performed following the standard protocol during bolus administration of intravenous contrast.  Contrast: OMNIPAQUE IOHEXOL 300 MG/ML  SOLN  Comparison: Multiple exams, including 12/22/2011  Findings: Dependent subsegmental atelectasis noted in the lower lobes.  Contrast medium in the distal esophagus probably reflects gastroesophageal reflux. Small hiatal hernia.  Hepatic steatosis noted.  No specific worrisome focal liver lesion. The spleen, adrenal glands, and pancreas appear unremarkable. The gallbladder and biliary system appear unremarkable.  The stomach is moderately distended with contrast medium which extends into the duodenal bulb and proximal small bowel, but which does not extend to the distal small bowel.  Timing of the scan relative to oral contrast administration is unknown.  The kidneys appear unremarkable, as do the proximal ureters.  No pathologic retroperitoneal or porta hepatis adenopathy is identified.  Aortoiliac atherosclerotic calcification noted.  Terminal ileum unremarkable.  Urinary bladder normal.  Uterus  absent.  No ascites noted. No pathologic pelvic adenopathy is identified.  Degenerative disc disease noted at the L4-5 and L5-S1 levels, with suspected foraminal impingement on the left at L5-S1 primarily from facet arthropathy.  IMPRESSION:  1.  Gastroesophageal reflux with small hiatal hernia. 2.  Hepatic steatosis. 3.  Suspected mild left foraminal impingement at L5-S1. 4.  Dependent subsegmental atelectasis in the lower lobes of the lungs. 5.  Atherosclerosis.   Original Report Authenticated By: Gaylyn Rong, M.D.    Nm Pet Image Restag (ps) Skull Base To Thigh  12/22/2011  *RADIOLOGY REPORT*  Clinical Data: Subsequent treatment strategy for left breast cancer.  NUCLEAR MEDICINE PET SKULL BASE TO THIGH  Fasting Blood Glucose:  97  Technique:  16.5 mCi F-18 FDG was injected intravenously. CT data was obtained and used for attenuation correction and anatomic localization only.  (This was not acquired as a diagnostic CT examination.) Additional exam technical data entered on technologist worksheet.  Comparison:  CT thorax 12/22/2011  Findings:  Neck: No hypermetabolic lymph nodes in the neck.  Chest:  There is mild metabolic activity associate with the peripheral nodular pleural thickening in the left upper lobe which is most likely inflammation related to the radiation therapy.  No hypermetabolic mediastinal lymph nodes.  Abdomen/Pelvis:  No abnormal hypermetabolic activity within the liver, pancreas, adrenal glands, or spleen.  No hypermetabolic lymph nodes in the abdomen or pelvis.  Skeleton:  No focal hypermetabolic activity to suggest skeletal metastasis.  IMPRESSION: No evidence of breast cancer recurrence or metastasis.   Original Report Authenticated By: Genevive Bi, M.D.       [place], [place] 12/28/11 2159

## 2011-12-28 NOTE — Telephone Encounter (Signed)
°  Caller: [name]/Patient; Patient Name: Andrea Golden, Andrea Golden; PCP: Ronna Polio (Adults only); Best Callback Phone Number: [phone]; Reason for call:  She has swelling in her neck and just "feels awful" and is SOB. She wants to see Dr. Dan Humphreys.  Worst symptoms today is pain  in neck and chest.  States "I have never felt this bad in my life".  Sx started this AM. She has darkness under her eyes and is somewhat lightheaded.  She also had nausea this AM.  No vomiting and having BM's more frequently.  Her HR is 95 and pulse OX is 98%.   Traiged Weakness or Paralysis and needs to go to the E/R by 911 for pressure, fullness, or squeezing senastion or pain anywhere in the chest last 5 or more minutes now or within the last hr.  She states  chest pain is geting worse.  Encopuraged to call 911, but  states her husband will take her to [facility].  She will take 4 baby ASA 81 mg with a sip of water.  She had e-mailed Dr. Dan Humphreys earlier, so please ignore this.

## 2011-12-28 NOTE — ED Notes (Signed)
Pain is squeezing.

## 2011-12-28 NOTE — ED Notes (Signed)
PT attempted to void for urine sample but unable at this time.

## 2011-12-28 NOTE — ED Provider Notes (Signed)
History     CSN: [id number]  Arrival date & time 12/28/11  1139   First MD Initiated Contact with Patient 12/28/11 1312      Chief Complaint  Patient presents with  . Chest Pain  . Shortness of Breath    (Consider location/radiation/quality/duration/timing/severity/associated sxs/prior treatment) HPI Pt presents with c/o epigastric pain.  She states pain began this morning and is a sharp and gnawing type pain which goes through to her back.  She also c/o nausea, no vomiting.  No fever/chills.  No difficulty breathing.  Specifically denies chest pain during my evaluation.  She has been taking zantac recently for GERD.  States this pain is different from her prior GERD symptoms.  There are no other associated systemic symptoms, there are no other alleviating or modifying factors. Has hx of breast cancer with recent PET scan which was clear.    Past Medical History  Diagnosis Date  . Cancer 2011    left breast, s/p XRT and chemo, Dr. Kerin Salen  . Fibromyalgia   . 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 Arthritis   . Hypertension     Past Surgical History  Procedure Date  . Abdominal hysterectomy 1985    one ovary remains  . Appendectomy   . Tonsillectomy   . Wisdom tooth extraction   . Mohs surgery     [place], nose  . Breast surgery     partial mastectomy, Dr. Evette Cristal    Family History  Problem Relation Age of Onset  . Cancer Mother     melanoma, uterine  . Kidney disease Mother   . Cancer Father     lung  . ALS Sister   . Cancer Brother     colon  . Cancer Brother     leukemia    History  Substance Use Topics  . Smoking status: Former Smoker    Quit date: 12/10/2006  . Smokeless tobacco: Never Used  . Alcohol Use: No    OB History    Grav Para Term Preterm Abortions TAB SAB Ect Mult Living                  Review of Systems ROS reviewed and all otherwise negative except for mentioned in HPI  Allergies  Benadryl; Lyrica; Contrast media; Gluten; Lidocaine;  Novocain; Sulfa drugs cross reactors; Codeine; and Topamax  Home Medications   No current outpatient prescriptions on file.  BP 110/56  Pulse 98  Temp 97.8 F (36.6 C) (Oral)  Resp 16  Ht 5\' 5"  (1.651 m)  Wt 164 lb 3.9 oz (74.5 kg)  BMI 27.33 kg/m2  SpO2 95% Vitals reviewed Physical Exam Physical Examination: General appearance - alert, well appearing, and in no distress Mental status - alert, oriented to person, place, and time Eyes - no scleral icterus, no conjunctival injection Mouth - mucous membranes moist, pharynx normal without lesions Chest - clear to auscultation, no wheezes, rales or rhonchi, symmetric air entry Heart - normal rate, regular rhythm, normal S1, S2, no murmurs, rubs, clicks or gallops Abdomen - soft, ttp in epigastric region, no gaurding or rebound, nondistended, no masses or organomegaly, NABS Extremities - peripheral pulses normal, no pedal edema, no clubbing or cyanosis Skin - normal coloration and turgor, no rashes  ED Course  Procedures (including critical care time)   Date: 12/28/2011  Rate: 88  Rhythm: normal sinus rhythm  QRS Axis: normal  Intervals: normal  ST/T Wave abnormalities: normal  Conduction Disutrbances:  none  Narrative Interpretation: unremarkable     2:21 PM pt to be moved to CDU for holding while completing her workup.  D/w Trixie Dredge, PA   Labs Reviewed  HEPATIC FUNCTION PANEL - Abnormal; Notable for the following:    Total Bilirubin 0.2 (*)     All other components within normal limits  URINALYSIS, ROUTINE W REFLEX MICROSCOPIC - Abnormal; Notable for the following:    Hgb urine dipstick TRACE (*)     All other components within normal limits  COMPREHENSIVE METABOLIC PANEL - Abnormal; Notable for the following:    Glucose, Bld 108 (*)     GFR calc non Af Amer 89 (*)     All other components within normal limits  CBC - Abnormal; Notable for the following:    RBC 3.44 (*)     Hemoglobin 11.4 (*)     HCT 33.9 (*)      All other components within normal limits  CBC  BASIC METABOLIC PANEL  LIPASE, BLOOD  POCT I-STAT TROPONIN I  URINE MICROSCOPIC-ADD ON   Dg Chest 2 View  12/28/2011  *RADIOLOGY REPORT*  Clinical Data: Chest pain, shortness of breath.  CHEST - 2 VIEW  Comparison: 12/22/2011 CT  Findings: Lungs are clear. No pleural effusion or pneumothorax. The cardiomediastinal contours are within normal limits. The visualized bones and soft tissues are without significant appreciable abnormality.  IMPRESSION: No radiographic evidence of acute cardiopulmonary process.   Original Report Authenticated By: Jearld Lesch, M.D.    US Abdomen Complete  12/28/2011  *RADIOLOGY REPORT*  Clinical Data:  Epigastric and right upper abdominal pain.  ABDOMEN ULTRASOUND  Technique:  Complete abdominal ultrasound examination was performed including evaluation of the liver, gallbladder, bile ducts, pancreas, kidneys, spleen, IVC, and abdominal aorta.  Comparison:  None  Findings:  Gallbladder:  No shadowing gallstones or echogenic sludge.  No gallbladder wall thickening or pericholecystic fluid.  Negative sonographic Murphy's sign according to the ultrasound technologist.  Common Bile Duct:  Normal caliber of 5 mm.  Liver:  The liver demonstrates heterogeneous echotexture and generalized increase echogenicity suggestive of steatosis.  No focal masses are identified by ultrasound in the liver.  IVC:  Patent throughout its visualized course in the abdomen.  Pancreas:  Although the pancreas is difficult to visualize in its entirety, no focal pancreatic abnormality is identified.  Spleen:  The spleen shows normal echotexture and size.  Kidneys:  The right kidney measures 10.5 cm and the left kidney also 10.5 cm in estimated length.  Both have normal sonographic appearance without evidence of hydronephrosis or focal lesion.  Abdominal Aorta:  Normal caliber abdominal aorta.  IMPRESSION: Heterogeneous and increased echotexture of the  liver suggestive of hepatic steatosis.   Original Report Authenticated By: Irish Lack, M.D.    Ct Abdomen Pelvis W Contrast  12/28/2011  *RADIOLOGY REPORT*  Clinical Data: Midsternal and upper abdominal pain.  CT ABDOMEN AND PELVIS WITH CONTRAST  Technique:  Multidetector CT imaging of the abdomen and pelvis was performed following the standard protocol during bolus administration of intravenous contrast.  Contrast: OMNIPAQUE IOHEXOL 300 MG/ML  SOLN  Comparison: Multiple exams, including 12/22/2011  Findings: Dependent subsegmental atelectasis noted in the lower lobes.  Contrast medium in the distal esophagus probably reflects gastroesophageal reflux. Small hiatal hernia.  Hepatic steatosis noted.  No specific worrisome focal liver lesion. The spleen, adrenal glands, and pancreas appear unremarkable. The gallbladder and biliary system appear unremarkable.  The stomach is moderately  distended with contrast medium which extends into the duodenal bulb and proximal small bowel, but which does not extend to the distal small bowel.  Timing of the scan relative to oral contrast administration is unknown.  The kidneys appear unremarkable, as do the proximal ureters.  No pathologic retroperitoneal or porta hepatis adenopathy is identified.  Aortoiliac atherosclerotic calcification noted.  Terminal ileum unremarkable.  Urinary bladder normal.  Uterus absent.  No ascites noted. No pathologic pelvic adenopathy is identified.  Degenerative disc disease noted at the L4-5 and L5-S1 levels, with suspected foraminal impingement on the left at L5-S1 primarily from facet arthropathy.  IMPRESSION:  1.  Gastroesophageal reflux with small hiatal hernia. 2.  Hepatic steatosis. 3.  Suspected mild left foraminal impingement at L5-S1. 4.  Dependent subsegmental atelectasis in the lower lobes of the lungs. 5.  Atherosclerosis.   Original Report Authenticated By: Gaylyn Rong, M.D.      1. Epigastric pain       MDM    Pt presenting with epigastric and right upper abdominal pain radiating through to her back.  Chekcing labs, given IV pain meds and antiemetics, ultrasound ordered.  Nursing notes states chest pain but patient specifically denies chest pain to me.  EKG reassuring as well.  Plan for abdominal ultrasound and if this is negative would proceed to CT scan.  D/w Trixie Dredge PA at 2:20pm for further management in CDU        Ethelda Chick, MD 12/29/11 [phone]

## 2011-12-28 NOTE — Telephone Encounter (Signed)
Left message on machine at home for patient to give [place] a call to let us know how she is doing.  Patient was advised to call 9-1-1 by triage.

## 2011-12-28 NOTE — ED Notes (Signed)
Pt has midsternal and upper abdominal pain that goes straight through to back and just started this am.  Pt reports DOE and not feeling well.  Reports some nausea

## 2011-12-28 NOTE — ED Notes (Signed)
Unable to obtain IV . IV team called to start.

## 2011-12-29 ENCOUNTER — Encounter: Payer: Self-pay | Admitting: Internal Medicine

## 2011-12-29 ENCOUNTER — Encounter (HOSPITAL_COMMUNITY): Payer: Self-pay | Admitting: *Deleted

## 2011-12-29 DIAGNOSIS — F341 Dysthymic disorder: Secondary | ICD-10-CM

## 2011-12-29 DIAGNOSIS — IMO0001 Reserved for inherently not codable concepts without codable children: Secondary | ICD-10-CM

## 2011-12-29 LAB — COMPREHENSIVE METABOLIC PANEL
ALT: 18 U/L (ref 0–35)
AST: 22 U/L (ref 0–37)
Albumin: 3.5 g/dL (ref 3.5–5.2)
Calcium: 8.5 mg/dL (ref 8.4–10.5)
Creatinine, Ser: 0.72 mg/dL (ref 0.50–1.10)
Sodium: 137 mEq/L (ref 135–145)

## 2011-12-29 LAB — CBC
HCT: 33.9 % — ABNORMAL LOW (ref 36.0–46.0)
Hemoglobin: 11.4 g/dL — ABNORMAL LOW (ref 12.0–15.0)
MCH: 33.1 pg (ref 26.0–34.0)
MCHC: 33.6 g/dL (ref 30.0–36.0)
MCV: 98.5 fL (ref 78.0–100.0)
RBC: 3.44 MIL/uL — ABNORMAL LOW (ref 3.87–5.11)

## 2011-12-29 MED ORDER — PANTOPRAZOLE SODIUM 40 MG IV SOLR
40.0000 mg | INTRAVENOUS | Status: DC
  Administered 2011-12-29: 40 mg via INTRAVENOUS
  Filled 2011-12-29 (×2): qty 40

## 2011-12-29 MED ORDER — METRONIDAZOLE 1 % EX GEL
1.0000 "application " | Freq: Every morning | CUTANEOUS | Status: DC
  Filled 2011-12-29 (×9): qty 60

## 2011-12-29 MED ORDER — ASPIRIN 81 MG PO TABS: 81.0000 mg | ORAL_TABLET | Freq: Every day | ORAL | Status: DC

## 2011-12-29 MED ORDER — ALPRAZOLAM 0.5 MG PO TABS
0.5000 mg | ORAL_TABLET | Freq: Every day | ORAL | Status: DC
  Administered 2011-12-29 – 2011-12-31 (×3): 0.5 mg via ORAL
  Filled 2011-12-29 (×3): qty 1

## 2011-12-29 MED ORDER — SENNOSIDES-DOCUSATE SODIUM 8.6-50 MG PO TABS
1.0000 | ORAL_TABLET | Freq: Every evening | ORAL | Status: DC | PRN
  Filled 2011-12-29: qty 1

## 2011-12-29 MED ORDER — ALUM & MAG HYDROXIDE-SIMETH 200-200-20 MG/5ML PO SUSP
30.0000 mL | Freq: Four times a day (QID) | ORAL | Status: DC | PRN
  Filled 2011-12-29: qty 30

## 2011-12-29 MED ORDER — MORPHINE SULFATE 4 MG/ML IJ SOLN
2.0000 mg | INTRAMUSCULAR | Status: DC | PRN
  Administered 2011-12-29 (×4): 2 mg via INTRAVENOUS
  Filled 2011-12-29 (×4): qty 1

## 2011-12-29 MED ORDER — SUCRALFATE 1 GM/10ML PO SUSP
1.0000 g | Freq: Three times a day (TID) | ORAL | Status: DC
  Administered 2011-12-29 – 2012-01-01 (×10): 1 g via ORAL
  Filled 2011-12-29 (×16): qty 10

## 2011-12-29 MED ORDER — ACETAMINOPHEN 325 MG PO TABS
650.0000 mg | ORAL_TABLET | Freq: Four times a day (QID) | ORAL | Status: DC | PRN
  Administered 2011-12-30: 650 mg via ORAL
  Filled 2011-12-29: qty 2

## 2011-12-29 MED ORDER — CITALOPRAM HYDROBROMIDE 20 MG PO TABS
20.0000 mg | ORAL_TABLET | Freq: Every day | ORAL | Status: DC
  Administered 2011-12-29 – 2012-01-01 (×3): 20 mg via ORAL
  Filled 2011-12-29 (×5): qty 1

## 2011-12-29 MED ORDER — HYDROCODONE-ACETAMINOPHEN 5-325 MG PO TABS
1.0000 | ORAL_TABLET | ORAL | Status: DC | PRN
  Administered 2011-12-29: 1 via ORAL
  Administered 2011-12-29 – 2011-12-31 (×2): 2 via ORAL
  Filled 2011-12-29: qty 1
  Filled 2011-12-29: qty 2
  Filled 2011-12-29: qty 1
  Filled 2011-12-29: qty 2

## 2011-12-29 MED ORDER — ONDANSETRON HCL 4 MG PO TABS: 4.0000 mg | ORAL_TABLET | Freq: Four times a day (QID) | ORAL | Status: DC | PRN

## 2011-12-29 MED ORDER — SODIUM CHLORIDE 0.9 % IJ SOLN
3.0000 mL | Freq: Two times a day (BID) | INTRAMUSCULAR | Status: DC
  Administered 2011-12-29: 3 mL via INTRAVENOUS
  Administered 2011-12-31: 22:00:00 via INTRAVENOUS
  Administered 2011-12-31: 3 mL via INTRAVENOUS

## 2011-12-29 MED ORDER — ENOXAPARIN SODIUM 40 MG/0.4ML ~~LOC~~ SOLN
40.0000 mg | SUBCUTANEOUS | Status: DC
  Administered 2011-12-29 – 2012-01-01 (×3): 40 mg via SUBCUTANEOUS
  Filled 2011-12-29 (×4): qty 0.4

## 2011-12-29 MED ORDER — METRONIDAZOLE 0.75 % EX GEL
Freq: Every day | CUTANEOUS | Status: DC
  Administered 2011-12-29 – 2011-12-30 (×2): via TOPICAL
  Filled 2011-12-29: qty 45

## 2011-12-29 MED ORDER — ONDANSETRON HCL 4 MG/2ML IJ SOLN: 4.0000 mg | Freq: Four times a day (QID) | INTRAMUSCULAR | Status: DC | PRN

## 2011-12-29 MED ORDER — POTASSIUM CHLORIDE IN NACL 20-0.9 MEQ/L-% IV SOLN
INTRAVENOUS | Status: DC
  Administered 2011-12-29 – 2011-12-30 (×3): via INTRAVENOUS
  Filled 2011-12-29 (×5): qty 1000

## 2011-12-29 MED ORDER — ACETAMINOPHEN 650 MG RE SUPP: 650.0000 mg | Freq: Four times a day (QID) | RECTAL | Status: DC | PRN

## 2011-12-29 MED ORDER — ASPIRIN 81 MG PO CHEW
81.0000 mg | CHEWABLE_TABLET | Freq: Every day | ORAL | Status: DC
  Administered 2011-12-29 – 2012-01-01 (×3): 81 mg via ORAL
  Filled 2011-12-29 (×3): qty 1

## 2011-12-29 NOTE — Progress Notes (Signed)
[facility] PROGRESS NOTE  Andrea Golden ZOX:[id number] DOB: January 16, 1951 DOA: 12/28/2011 PCP: Shelia Media, MD  Assessment/Plan: Epigastric abdominal pain  -CT abd with findings c/w GERD and hiatal hernia, abd [place] neg for gall stones, lipase wnl, hgb stable - will continue IV protonix, supportive care and add carafate - follow and if not improving in am, will recommend GI consult in am . Fibromyalgia . Depression with anxiety . Neck pain  breast cancer  stable resume home medications      Code Status: full Family Communication: husband at bedside Disposition Plan: to home when medically ready   Consultants:  none  Procedures:  none  Antibiotics:  none  HPI/Subjective: C/o epigatric pain, +nausea but no vomiting  Objective: Filed Vitals:   12/29/11 0127 12/29/11 0218 12/29/11 0612 12/29/11 0935  BP: 108/61 119/54 101/51 110/56  Pulse:  86 99 98  Temp:  98 F (36.7 C) 98.4 F (36.9 C) 97.8 F (36.6 C)  TempSrc:  Oral Oral Oral  Resp: 16 16 16 16   Height:  5\' 5"  (1.651 m)    Weight:  74.5 kg (164 lb 3.9 oz)    SpO2: 99% 95% 94% 95%    Intake/Output Summary (Last 24 hours) at 12/29/11 1159 Last data filed at 12/29/11 0600  Gross per 24 hour  Intake 1266.25 ml  Output      0 ml  Net 1266.25 ml   Filed Weights   12/29/11 0218  Weight: 74.5 kg (164 lb 3.9 oz)    Exam:   General:  Alert and oriented x3, in NAD  Cardiovascular: RRR, nl S1S2  Respiratory: CTAB Abdomen: soft+BS,epigastric/upper abdominal tenderness, no rebound tenderness, no masses palpable  Data Reviewed: Basic Metabolic Panel:  Lab 12/29/11 8119 12/28/11 1246  NA 137 140  K 3.9 4.4  CL 103 104  CO2 26 27  GLUCOSE 108* 98  BUN 10 10  CREATININE 0.72 0.70  CALCIUM 8.5 9.8  MG -- --  PHOS -- --   Liver Function Tests:  Lab 12/29/11 0240 12/28/11 1246  AST 22 27  ALT 18 22  ALKPHOS 66 75  BILITOT 0.3 0.2*  PROT 6.7 7.8  ALBUMIN 3.5 4.2    Lab 12/28/11  1246  LIPASE 40  AMYLASE --   No results found for this basename: AMMONIA:5 in the last 168 hours CBC:  Lab 12/29/11 0240 12/28/11 1246  WBC 8.1 6.6  NEUTROABS -- --  HGB 11.4* 13.3  HCT 33.9* 39.0  MCV 98.5 96.8  PLT 213 238   Cardiac Enzymes: No results found for this basename: CKTOTAL:5,CKMB:5,CKMBINDEX:5,TROPONINI:5 in the last 168 hours BNP (last 3 results) No results found for this basename: PROBNP:3 in the last 8760 hours CBG: No results found for this basename: GLUCAP:5 in the last 168 hours  No results found for this or any previous visit (from the past 240 hour(s)).   Studies: Dg Chest 2 View  12/28/2011  *RADIOLOGY REPORT*  Clinical Data: Chest pain, shortness of breath.  CHEST - 2 VIEW  Comparison: 12/22/2011 CT  Findings: Lungs are clear. No pleural effusion or pneumothorax. The cardiomediastinal contours are within normal limits. The visualized bones and soft tissues are without significant appreciable abnormality.  IMPRESSION: No radiographic evidence of acute cardiopulmonary process.   Original Report Authenticated By: Jearld Lesch, M.D.    US Abdomen Complete  12/28/2011  *RADIOLOGY REPORT*  Clinical Data:  Epigastric and right upper abdominal pain.  ABDOMEN ULTRASOUND  Technique:  Complete  abdominal ultrasound examination was performed including evaluation of the liver, gallbladder, bile ducts, pancreas, kidneys, spleen, IVC, and abdominal aorta.  Comparison:  None  Findings:  Gallbladder:  No shadowing gallstones or echogenic sludge.  No gallbladder wall thickening or pericholecystic fluid.  Negative sonographic Murphy's sign according to the ultrasound technologist.  Common Bile Duct:  Normal caliber of 5 mm.  Liver:  The liver demonstrates heterogeneous echotexture and generalized increase echogenicity suggestive of steatosis.  No focal masses are identified by ultrasound in the liver.  IVC:  Patent throughout its visualized course in the abdomen.  Pancreas:   Although the pancreas is difficult to visualize in its entirety, no focal pancreatic abnormality is identified.  Spleen:  The spleen shows normal echotexture and size.  Kidneys:  The right kidney measures 10.5 cm and the left kidney also 10.5 cm in estimated length.  Both have normal sonographic appearance without evidence of hydronephrosis or focal lesion.  Abdominal Aorta:  Normal caliber abdominal aorta.  IMPRESSION: Heterogeneous and increased echotexture of the liver suggestive of hepatic steatosis.   Original Report Authenticated By: Irish Lack, M.D.    Ct Abdomen Pelvis W Contrast  12/28/2011  *RADIOLOGY REPORT*  Clinical Data: Midsternal and upper abdominal pain.  CT ABDOMEN AND PELVIS WITH CONTRAST  Technique:  Multidetector CT imaging of the abdomen and pelvis was performed following the standard protocol during bolus administration of intravenous contrast.  Contrast: OMNIPAQUE IOHEXOL 300 MG/ML  SOLN  Comparison: Multiple exams, including 12/22/2011  Findings: Dependent subsegmental atelectasis noted in the lower lobes.  Contrast medium in the distal esophagus probably reflects gastroesophageal reflux. Small hiatal hernia.  Hepatic steatosis noted.  No specific worrisome focal liver lesion. The spleen, adrenal glands, and pancreas appear unremarkable. The gallbladder and biliary system appear unremarkable.  The stomach is moderately distended with contrast medium which extends into the duodenal bulb and proximal small bowel, but which does not extend to the distal small bowel.  Timing of the scan relative to oral contrast administration is unknown.  The kidneys appear unremarkable, as do the proximal ureters.  No pathologic retroperitoneal or porta hepatis adenopathy is identified.  Aortoiliac atherosclerotic calcification noted.  Terminal ileum unremarkable.  Urinary bladder normal.  Uterus absent.  No ascites noted. No pathologic pelvic adenopathy is identified.  Degenerative disc disease  noted at the L4-5 and L5-S1 levels, with suspected foraminal impingement on the left at L5-S1 primarily from facet arthropathy.  IMPRESSION:  1.  Gastroesophageal reflux with small hiatal hernia. 2.  Hepatic steatosis. 3.  Suspected mild left foraminal impingement at L5-S1. 4.  Dependent subsegmental atelectasis in the lower lobes of the lungs. 5.  Atherosclerosis.   Original Report Authenticated By: Gaylyn Rong, M.D.     Scheduled Meds:   . ALPRAZolam  0.5 mg Oral QHS  . aspirin  81 mg Oral Daily  . citalopram  20 mg Oral Daily  . enoxaparin (LOVENOX) injection  40 mg Subcutaneous Q24H  . [COMPLETED] gi cocktail  30 mL Oral Once  . [COMPLETED]  HYDROmorphone (DILAUDID) injection  1 mg Intravenous Once  . [COMPLETED]  HYDROmorphone (DILAUDID) injection  1 mg Intravenous NOW  . [COMPLETED]  HYDROmorphone (DILAUDID) injection  1 mg Intravenous Once  . metroNIDAZOLE   Topical Daily  . [COMPLETED] ondansetron  4 mg Intravenous Once  . [COMPLETED] ondansetron  4 mg Intravenous Once  . pantoprazole (PROTONIX) IV  40 mg Intravenous Q24H  . [COMPLETED] promethazine  12.5 mg Intravenous Once  . [  COMPLETED] sodium chloride  1,000 mL Intravenous Once  . sodium chloride  3 mL Intravenous Q12H  . sucralfate  1 g Oral TID WC & HS  . [DISCONTINUED] aspirin  81 mg Oral Daily  . [DISCONTINUED] metroNIDAZOLE  1 application Topical q morning - 10a   Continuous Infusions:   . 0.9 % NaCl with KCl 20 mEq / L 75 mL/hr at 12/29/11 0403    Active Problems:  Fibromyalgia  History of breast cancer in female  Neck pain  Depression with anxiety  Epigastric abdominal pain    Time spent:    Kela Millin  [facility] Pager [phone]. If 8PM-8AM, please contact night-coverage at [email or web address], password [email or web address] 12/29/2011, 11:59 AM  LOS: 1 day

## 2011-12-29 NOTE — ED Provider Notes (Signed)
Medical screening examination/treatment/procedure(s) were conducted as a shared visit with non-physician practitioner(s) and myself.  I personally evaluated the patient during the encounter  I saw this patient primarily  Ethelda Chick, MD 12/29/11 [phone]

## 2011-12-29 NOTE — Progress Notes (Signed)
Utilization review completed.  

## 2011-12-29 NOTE — Telephone Encounter (Signed)
Spoke with patient via telephone, she is at [facility] and was diagnosed with hiatal hernia.   She was admitted.

## 2011-12-30 ENCOUNTER — Encounter: Payer: Self-pay | Admitting: *Deleted

## 2011-12-30 DIAGNOSIS — Z853 Personal history of malignant neoplasm of breast: Secondary | ICD-10-CM

## 2011-12-30 DIAGNOSIS — R1013 Epigastric pain: Secondary | ICD-10-CM

## 2011-12-30 DIAGNOSIS — C50919 Malignant neoplasm of unspecified site of unspecified female breast: Secondary | ICD-10-CM

## 2011-12-30 MED ORDER — PANTOPRAZOLE SODIUM 40 MG PO TBEC
40.0000 mg | DELAYED_RELEASE_TABLET | Freq: Every day | ORAL | Status: DC
  Administered 2011-12-30 – 2012-01-01 (×2): 40 mg via ORAL
  Filled 2011-12-30 (×2): qty 1

## 2011-12-30 NOTE — Progress Notes (Signed)
[facility] PROGRESS NOTE  Andrea Golden MRN:[id number] DOB: 04/29/1950 DOA: 12/28/2011 PCP: WALKER,JENNIFER A, MD  Assessment/Plan: Epigastric abdominal pain and N/V/D Suspect combination of gastroenteritis with GERD worsened by NSAID use Vomiting and diarrhea resolved, still with some nausea and occasional pain -CT abd with findings c/w GERD and hiatal hernia, abd US neg for gall stones, lipase wnl, hgb stable - will continue supportive care with protonix, and  carafate - advance diet, depending on clinical course today, FU with Gi  H/o colonoscopy about 5 years ago, reportedly normal  . Fibromyalgia . Depression with anxiety . Neck pain  breast cancer  stable resume home medications      Code Status: full Family Communication: patient at bedside Disposition Plan: to home when medically ready   Consultants:  none  Procedures:  none  Antibiotics:  none  HPI/Subjective: C/o epigatric pain, +nausea but no vomiting  Objective: Filed Vitals:   12/29/11 1716 12/29/11 2038 12/30/11 0514 12/30/11 0755  BP: 120/68 101/41 103/50 112/54  Pulse: 99 93 86 64  Temp: 98.3 F (36.8 C) 98.8 F (37.1 C) 99.2 F (37.3 C) 98.5 F (36.9 C)  TempSrc: Oral Oral Oral Oral  Resp: 18 18 18 18  Height:      Weight:  74.6 kg (164 lb 7.4 oz)    SpO2: 97% 96% 96% 96%    Intake/Output Summary (Last 24 hours) at 12/30/11 1207 Last data filed at 12/30/11 0745  Gross per 24 hour  Intake   2065 ml  Output    579 ml  Net   1486 ml   Filed Weights   12/29/11 0218 12/29/11 2038  Weight: 74.5 kg (164 lb 3.9 oz) 74.6 kg (164 lb 7.4 oz)    Exam:   General:  Alert and oriented x3, in NAD  Cardiovascular: RRR, nl S1S2  Respiratory: CTAB Abdomen: soft+BS,mild epigastric/upper abdominal tenderness, no rebound tenderness, no masses palpable  Data Reviewed: Basic Metabolic Panel:  Lab 12/29/11 0240 12/28/11 1246  NA 137 140  K 3.9 4.4  CL 103 104  CO2 26 27    GLUCOSE 108* 98  BUN 10 10  CREATININE 0.72 0.70  CALCIUM 8.5 9.8  MG -- --  PHOS -- --   Liver Function Tests:  Lab 12/29/11 0240 12/28/11 1246  AST 22 27  ALT 18 22  ALKPHOS 66 75  BILITOT 0.3 0.2*  PROT 6.7 7.8  ALBUMIN 3.5 4.2    Lab 12/28/11 1246  LIPASE 40  AMYLASE --   No results found for this basename: AMMONIA:5 in the last 168 hours CBC:  Lab 12/29/11 0240 12/28/11 1246  WBC 8.1 6.6  NEUTROABS -- --  HGB 11.4* 13.3  HCT 33.9* 39.0  MCV 98.5 96.8  PLT 213 238   Cardiac Enzymes: No results found for this basename: CKTOTAL:5,CKMB:5,CKMBINDEX:5,TROPONINI:5 in the last 168 hours BNP (last 3 results) No results found for this basename: PROBNP:3 in the last 8760 hours CBG: No results found for this basename: GLUCAP:5 in the last 168 hours  No results found for this or any previous visit (from the past 240 hour(s)).   Studies: Dg Chest 2 View  12/28/2011  *RADIOLOGY REPORT*  Clinical Data: Chest pain, shortness of breath.  CHEST - 2 VIEW  Comparison: 12/22/2011 CT  Findings: Lungs are clear. No pleural effusion or pneumothorax. The cardiomediastinal contours are within normal limits. The visualized bones and soft tissues are without significant appreciable abnormality.  IMPRESSION: No radiographic evidence of   acute cardiopulmonary process.   Original Report Authenticated By: Andrew  DelGaizo, M.D.    Us Abdomen Complete  12/28/2011  *RADIOLOGY REPORT*  Clinical Data:  Epigastric and right upper abdominal pain.  ABDOMEN ULTRASOUND  Technique:  Complete abdominal ultrasound examination was performed including evaluation of the liver, gallbladder, bile ducts, pancreas, kidneys, spleen, IVC, and abdominal aorta.  Comparison:  None  Findings:  Gallbladder:  No shadowing gallstones or echogenic sludge.  No gallbladder wall thickening or pericholecystic fluid.  Negative sonographic Murphy's sign according to the ultrasound technologist.  Common Bile Duct:  Normal caliber  of 5 mm.  Liver:  The liver demonstrates heterogeneous echotexture and generalized increase echogenicity suggestive of steatosis.  No focal masses are identified by ultrasound in the liver.  IVC:  Patent throughout its visualized course in the abdomen.  Pancreas:  Although the pancreas is difficult to visualize in its entirety, no focal pancreatic abnormality is identified.  Spleen:  The spleen shows normal echotexture and size.  Kidneys:  The right kidney measures 10.5 cm and the left kidney also 10.5 cm in estimated length.  Both have normal sonographic appearance without evidence of hydronephrosis or focal lesion.  Abdominal Aorta:  Normal caliber abdominal aorta.  IMPRESSION: Heterogeneous and increased echotexture of the liver suggestive of hepatic steatosis.   Original Report Authenticated By: Glenn Yamagata, M.D.    Ct Abdomen Pelvis W Contrast  12/28/2011  *RADIOLOGY REPORT*  Clinical Data: Midsternal and upper abdominal pain.  CT ABDOMEN AND PELVIS WITH CONTRAST  Technique:  Multidetector CT imaging of the abdomen and pelvis was performed following the standard protocol during bolus administration of intravenous contrast.  Contrast: 100mL OMNIPAQUE IOHEXOL 300 MG/ML  SOLN  Comparison: Multiple exams, including 12/22/2011  Findings: Dependent subsegmental atelectasis noted in the lower lobes.  Contrast medium in the distal esophagus probably reflects gastroesophageal reflux. Small hiatal hernia.  Hepatic steatosis noted.  No specific worrisome focal liver lesion. The spleen, adrenal glands, and pancreas appear unremarkable. The gallbladder and biliary system appear unremarkable.  The stomach is moderately distended with contrast medium which extends into the duodenal bulb and proximal small bowel, but which does not extend to the distal small bowel.  Timing of the scan relative to oral contrast administration is unknown.  The kidneys appear unremarkable, as do the proximal ureters.  No pathologic  retroperitoneal or porta hepatis adenopathy is identified.  Aortoiliac atherosclerotic calcification noted.  Terminal ileum unremarkable.  Urinary bladder normal.  Uterus absent.  No ascites noted. No pathologic pelvic adenopathy is identified.  Degenerative disc disease noted at the L4-5 and L5-S1 levels, with suspected foraminal impingement on the left at L5-S1 primarily from facet arthropathy.  IMPRESSION:  1.  Gastroesophageal reflux with small hiatal hernia. 2.  Hepatic steatosis. 3.  Suspected mild left foraminal impingement at L5-S1. 4.  Dependent subsegmental atelectasis in the lower lobes of the lungs. 5.  Atherosclerosis.   Original Report Authenticated By: Walter Liebkemann, M.D.     Scheduled Meds:    . ALPRAZolam  0.5 mg Oral QHS  . aspirin  81 mg Oral Daily  . citalopram  20 mg Oral Daily  . enoxaparin (LOVENOX) injection  40 mg Subcutaneous Q24H  . metroNIDAZOLE   Topical Daily  . pantoprazole  40 mg Oral Q1200  . sodium chloride  3 mL Intravenous Q12H  . sucralfate  1 g Oral TID WC & HS  . [DISCONTINUED] pantoprazole (PROTONIX) IV  40 mg Intravenous Q24H   Continuous   Infusions:    . 0.9 % NaCl with KCl 20 mEq / L 20 mL/hr at 12/30/11 0942    Active Problems:  Fibromyalgia  History of breast cancer in female  Neck pain  Depression with anxiety  Epigastric abdominal pain    Time spent: 30mins    Selena Swaminathan  [facility] Pager [phone]. If 8PM-8AM, please contact night-coverage at [email or web address], password [email or web address] 12/30/2011, 12:07 PM  LOS: 2 days              

## 2011-12-30 NOTE — Consult Note (Signed)
Pt seen and examined.  X-rays were reviewed.  Full note to follow. Acute onset epigastric pain radiating to back.  Lab work, CT and ultrasound unremarkable.  Scans mitigate against acute cholecystitis.  There is no evidence for pancreatitis.  This would be an unusual presentation for PUD.  Gastric volvulus may present this way although hiatal hernia appears small on CT.  Recommendations 1.  EGD in am] 2.  protonix 3.  Check amylase; repeat lipase 4.  T/c HIDA scan if above w/u is negative.

## 2011-12-30 NOTE — Progress Notes (Signed)
Mailed after appt letter to pt. 

## 2011-12-31 ENCOUNTER — Encounter (HOSPITAL_COMMUNITY)
Admission: EM | Disposition: A | Discharge status: Home / Self Care | Payer: Self-pay | Source: Home / Self Care | Attending: Emergency Medicine

## 2011-12-31 ENCOUNTER — Encounter (HOSPITAL_COMMUNITY): Payer: Self-pay

## 2011-12-31 DIAGNOSIS — K2990 Gastroduodenitis, unspecified, without bleeding: Secondary | ICD-10-CM

## 2011-12-31 DIAGNOSIS — K2970 Gastritis, unspecified, without bleeding: Secondary | ICD-10-CM

## 2011-12-31 HISTORY — PX: ESOPHAGOGASTRODUODENOSCOPY: SHX5428

## 2011-12-31 LAB — LIPASE, BLOOD: Lipase: 60 U/L — ABNORMAL HIGH (ref 11–59)

## 2011-12-31 SURGERY — EGD (ESOPHAGOGASTRODUODENOSCOPY)
Anesthesia: Moderate Sedation

## 2011-12-31 MED ORDER — GLYCOPYRROLATE 0.2 MG/ML IJ SOLN
INTRAMUSCULAR | Status: DC | PRN
  Administered 2011-12-31: 0.2 mg via INTRAVENOUS

## 2011-12-31 MED ORDER — SODIUM CHLORIDE 0.9 % IV SOLN
INTRAVENOUS | Status: DC
  Administered 2011-12-31: 500 mL via INTRAVENOUS

## 2011-12-31 MED ORDER — MIDAZOLAM HCL 5 MG/ML IJ SOLN
INTRAMUSCULAR | Status: AC
  Filled 2011-12-31: qty 2

## 2011-12-31 MED ORDER — FENTANYL CITRATE 0.05 MG/ML IJ SOLN
INTRAMUSCULAR | Status: AC
  Filled 2011-12-31: qty 2

## 2011-12-31 MED ORDER — GLYCOPYRROLATE 0.2 MG/ML IJ SOLN
INTRAMUSCULAR | Status: AC
  Filled 2011-12-31: qty 1

## 2011-12-31 MED ORDER — SODIUM CHLORIDE 0.45 % IV SOLN: INTRAVENOUS | Status: DC

## 2011-12-31 MED ORDER — FENTANYL CITRATE 0.05 MG/ML IJ SOLN
INTRAMUSCULAR | Status: DC | PRN
  Administered 2011-12-31 (×3): 25 ug via INTRAVENOUS

## 2011-12-31 MED ORDER — HYOSCYAMINE SULFATE 0.125 MG SL SUBL
0.2500 mg | SUBLINGUAL_TABLET | SUBLINGUAL | Status: DC | PRN
  Filled 2011-12-31: qty 2

## 2011-12-31 MED ORDER — MIDAZOLAM HCL 5 MG/5ML IJ SOLN
INTRAMUSCULAR | Status: DC | PRN
  Administered 2011-12-31: 2 mg via INTRAVENOUS
  Administered 2011-12-31: 1 mg via INTRAVENOUS
  Administered 2011-12-31: 2 mg via INTRAVENOUS

## 2011-12-31 NOTE — H&P (View-Only) (Signed)
[facility] PROGRESS NOTE  Andrea Golden ZOX:[id number] DOB: [birth year] DOA: 12/28/2011 PCP: Shelia Media, MD  Assessment/Plan: Epigastric abdominal pain and N/V/D Suspect combination of gastroenteritis with GERD worsened by NSAID use Vomiting and diarrhea resolved, still with some nausea and occasional pain -CT abd with findings c/w GERD and hiatal hernia, abd [place] neg for gall stones, lipase wnl, hgb stable - will continue supportive care with protonix, and  carafate - advance diet, depending on clinical course today, FU with Gi  H/o colonoscopy about 5 years ago, reportedly normal  . Fibromyalgia . Depression with anxiety . Neck pain  breast cancer  stable resume home medications      Code Status: full Family Communication: patient at bedside Disposition Plan: to home when medically ready   Consultants:  none  Procedures:  none  Antibiotics:  none  HPI/Subjective: C/o epigatric pain, +nausea but no vomiting  Objective: Filed Vitals:   12/29/11 1716 12/29/11 2038 12/30/11 0514 12/30/11 0755  BP: 120/68 101/41 103/50 112/54  Pulse: 99 93 86 64  Temp: 98.3 F (36.8 C) 98.8 F (37.1 C) 99.2 F (37.3 C) 98.5 F (36.9 C)  TempSrc: Oral Oral Oral Oral  Resp: 18 18 18 18   Height:      Weight:  74.6 kg (164 lb 7.4 oz)    SpO2: 97% 96% 96% 96%    Intake/Output Summary (Last 24 hours) at 12/30/11 1207 Last data filed at 12/30/11 0745  Gross per 24 hour  Intake   2065 ml  Output    579 ml  Net   1486 ml   Filed Weights   12/29/11 0218 12/29/11 2038  Weight: 74.5 kg (164 lb 3.9 oz) 74.6 kg (164 lb 7.4 oz)    Exam:   General:  Alert and oriented x3, in NAD  Cardiovascular: RRR, nl S1S2  Respiratory: CTAB Abdomen: soft+BS,mild epigastric/upper abdominal tenderness, no rebound tenderness, no masses palpable  Data Reviewed: Basic Metabolic Panel:  Lab 12/29/11 8119 12/28/11 1246  NA 137 140  K 3.9 4.4  CL 103 104  CO2 26 27    GLUCOSE 108* 98  BUN 10 10  CREATININE 0.72 0.70  CALCIUM 8.5 9.8  MG -- --  PHOS -- --   Liver Function Tests:  Lab 12/29/11 0240 12/28/11 1246  AST 22 27  ALT 18 22  ALKPHOS 66 75  BILITOT 0.3 0.2*  PROT 6.7 7.8  ALBUMIN 3.5 4.2    Lab 12/28/11 1246  LIPASE 40  AMYLASE --   No results found for this basename: AMMONIA:5 in the last 168 hours CBC:  Lab 12/29/11 0240 12/28/11 1246  WBC 8.1 6.6  NEUTROABS -- --  HGB 11.4* 13.3  HCT 33.9* 39.0  MCV 98.5 96.8  PLT 213 238   Cardiac Enzymes: No results found for this basename: CKTOTAL:5,CKMB:5,CKMBINDEX:5,TROPONINI:5 in the last 168 hours BNP (last 3 results) No results found for this basename: PROBNP:3 in the last 8760 hours CBG: No results found for this basename: GLUCAP:5 in the last 168 hours  No results found for this or any previous visit (from the past 240 hour(s)).   Studies: Dg Chest 2 View  12/28/2011  *RADIOLOGY REPORT*  Clinical Data: Chest pain, shortness of breath.  CHEST - 2 VIEW  Comparison: 12/22/2011 CT  Findings: Lungs are clear. No pleural effusion or pneumothorax. The cardiomediastinal contours are within normal limits. The visualized bones and soft tissues are without significant appreciable abnormality.  IMPRESSION: No radiographic evidence of  acute cardiopulmonary process.   Original Report Authenticated By: Jearld Lesch, M.D.    US Abdomen Complete  12/28/2011  *RADIOLOGY REPORT*  Clinical Data:  Epigastric and right upper abdominal pain.  ABDOMEN ULTRASOUND  Technique:  Complete abdominal ultrasound examination was performed including evaluation of the liver, gallbladder, bile ducts, pancreas, kidneys, spleen, IVC, and abdominal aorta.  Comparison:  None  Findings:  Gallbladder:  No shadowing gallstones or echogenic sludge.  No gallbladder wall thickening or pericholecystic fluid.  Negative sonographic Murphy's sign according to the ultrasound technologist.  Common Bile Duct:  Normal caliber  of 5 mm.  Liver:  The liver demonstrates heterogeneous echotexture and generalized increase echogenicity suggestive of steatosis.  No focal masses are identified by ultrasound in the liver.  IVC:  Patent throughout its visualized course in the abdomen.  Pancreas:  Although the pancreas is difficult to visualize in its entirety, no focal pancreatic abnormality is identified.  Spleen:  The spleen shows normal echotexture and size.  Kidneys:  The right kidney measures 10.5 cm and the left kidney also 10.5 cm in estimated length.  Both have normal sonographic appearance without evidence of hydronephrosis or focal lesion.  Abdominal Aorta:  Normal caliber abdominal aorta.  IMPRESSION: Heterogeneous and increased echotexture of the liver suggestive of hepatic steatosis.   Original Report Authenticated By: Irish Lack, M.D.    Ct Abdomen Pelvis W Contrast  12/28/2011  *RADIOLOGY REPORT*  Clinical Data: Midsternal and upper abdominal pain.  CT ABDOMEN AND PELVIS WITH CONTRAST  Technique:  Multidetector CT imaging of the abdomen and pelvis was performed following the standard protocol during bolus administration of intravenous contrast.  Contrast: OMNIPAQUE IOHEXOL 300 MG/ML  SOLN  Comparison: Multiple exams, including 12/22/2011  Findings: Dependent subsegmental atelectasis noted in the lower lobes.  Contrast medium in the distal esophagus probably reflects gastroesophageal reflux. Small hiatal hernia.  Hepatic steatosis noted.  No specific worrisome focal liver lesion. The spleen, adrenal glands, and pancreas appear unremarkable. The gallbladder and biliary system appear unremarkable.  The stomach is moderately distended with contrast medium which extends into the duodenal bulb and proximal small bowel, but which does not extend to the distal small bowel.  Timing of the scan relative to oral contrast administration is unknown.  The kidneys appear unremarkable, as do the proximal ureters.  No pathologic  retroperitoneal or porta hepatis adenopathy is identified.  Aortoiliac atherosclerotic calcification noted.  Terminal ileum unremarkable.  Urinary bladder normal.  Uterus absent.  No ascites noted. No pathologic pelvic adenopathy is identified.  Degenerative disc disease noted at the L4-5 and L5-S1 levels, with suspected foraminal impingement on the left at L5-S1 primarily from facet arthropathy.  IMPRESSION:  1.  Gastroesophageal reflux with small hiatal hernia. 2.  Hepatic steatosis. 3.  Suspected mild left foraminal impingement at L5-S1. 4.  Dependent subsegmental atelectasis in the lower lobes of the lungs. 5.  Atherosclerosis.   Original Report Authenticated By: Gaylyn Rong, M.D.     Scheduled Meds:    . ALPRAZolam  0.5 mg Oral QHS  . aspirin  81 mg Oral Daily  . citalopram  20 mg Oral Daily  . enoxaparin (LOVENOX) injection  40 mg Subcutaneous Q24H  . metroNIDAZOLE   Topical Daily  . pantoprazole  40 mg Oral Q1200  . sodium chloride  3 mL Intravenous Q12H  . sucralfate  1 g Oral TID WC & HS  . [DISCONTINUED] pantoprazole (PROTONIX) IV  40 mg Intravenous Q24H   Continuous  Infusions:    . 0.9 % NaCl with KCl 20 mEq / L 20 mL/hr at 12/30/11 1610    Active Problems:  Fibromyalgia  History of breast cancer in female  Neck pain  Depression with anxiety  Epigastric abdominal pain    Time spent:    [facility]  [facility] Pager [phone]. If 8PM-8AM, please contact night-coverage at [email or web address], password [facility] 12/30/2011, 12:07 PM  LOS: 2 days

## 2011-12-31 NOTE — Progress Notes (Signed)
[facility] PROGRESS NOTE  Andrea Golden BJY:[id number] DOB: 1950-12-13 DOA: 12/28/2011 PCP: Shelia Media, MD  Assessment/Plan: Epigastric abdominal pain and N/V/D Suspect GERD worsened by NSAID use, +/_ gastroenteritis Vomiting and diarrhea resolved, still with some nausea and occasional pain -CT abd with findings c/w GERD and hiatal hernia, abd [place] neg for gall stones, lipase wnl, hgb stable - will continue supportive care with protonix, and  carafate - advance diet, depending on clinical course today, EGD today, appreciate Gi input H/o colonoscopy about 5 years ago, reportedly normal  . Fibromyalgia . Depression with anxiety . Neck pain  breast cancer  stable resume home medications      Code Status: full Family Communication: patient at bedside Disposition Plan: home soon   Consultants:  none  Procedures:  none  Antibiotics:  none  HPI/Subjective: Still with occasional lower quadrant abdominal pain, +nausea but no vomiting  Objective: Filed Vitals:   12/31/11 1345 12/31/11 1400 12/31/11 1415 12/31/11 1428  BP: 140/77 161/75 150/78 147/59  Pulse:    80  Temp:    97.6 F (36.4 C)  TempSrc:    Oral  Resp: 15 31 15 20   Height:      Weight:      SpO2: 97% 96% 95% 96%    Intake/Output Summary (Last 24 hours) at 12/31/11 1554 Last data filed at 12/31/11 1400  Gross per 24 hour  Intake  948.5 ml  Output    550 ml  Net  398.5 ml   Filed Weights   12/29/11 0218 12/29/11 2038 12/30/11 2055  Weight: 74.5 kg (164 lb 3.9 oz) 74.6 kg (164 lb 7.4 oz) 73.1 kg (161 lb 2.5 oz)    Exam:   General:  Alert and oriented x3, in NAD  Cardiovascular: RRR, nl S1S2  Respiratory: CTAB Abdomen: soft+BS,mild epigastric/upper abdominal tenderness, no rebound tenderness, no masses palpable  Data Reviewed: Basic Metabolic Panel:  Lab 12/29/11 0865 12/28/11 1246  NA 137 140  K 3.9 4.4  CL 103 104  CO2 26 27  GLUCOSE 108* 98  BUN 10 10  CREATININE  0.72 0.70  CALCIUM 8.5 9.8  MG -- --  PHOS -- --   Liver Function Tests:  Lab 12/29/11 0240 12/28/11 1246  AST 22 27  ALT 18 22  ALKPHOS 66 75  BILITOT 0.3 0.2*  PROT 6.7 7.8  ALBUMIN 3.5 4.2    Lab 12/31/11 0757 12/28/11 1246  LIPASE 60* 40  AMYLASE -- --   No results found for this basename: AMMONIA:5 in the last 168 hours CBC:  Lab 12/29/11 0240 12/28/11 1246  WBC 8.1 6.6  NEUTROABS -- --  HGB 11.4* 13.3  HCT 33.9* 39.0  MCV 98.5 96.8  PLT 213 238   Cardiac Enzymes: No results found for this basename: CKTOTAL:5,CKMB:5,CKMBINDEX:5,TROPONINI:5 in the last 168 hours BNP (last 3 results) No results found for this basename: PROBNP:3 in the last 8760 hours CBG: No results found for this basename: GLUCAP:5 in the last 168 hours  No results found for this or any previous visit (from the past 240 hour(s)).   Studies: No results found.  Scheduled Meds:    . [MAR HOLD] ALPRAZolam  0.5 mg Oral QHS  . [facility] HOLD] aspirin  81 mg Oral Daily  . [facility] HOLD] citalopram  20 mg Oral Daily  . [MAR HOLD] enoxaparin (LOVENOX) injection  40 mg Subcutaneous Q24H  . [MAR HOLD] metroNIDAZOLE   Topical Daily  . [facility] HOLD] pantoprazole  40 mg Oral Q1200  . [MAR HOLD] sodium chloride  3 mL Intravenous Q12H  . [MAR HOLD] sucralfate  1 g Oral TID WC & HS   Continuous Infusions:    . sodium chloride    . 0.9 % NaCl with KCl 20 mEq / L 20 mL/hr at 12/31/11 1400  . [DISCONTINUED] sodium chloride 500 mL (12/31/11 1249)    Active Problems:  Fibromyalgia  History of breast cancer in female  Neck pain  Depression with anxiety  Epigastric abdominal pain  Gastroduodenitis    Time spent:    [facility]  [facility] Pager [phone]. If 8PM-8AM, please contact night-coverage at [email or web address], password [facility] 12/31/2011, 3:54 PM  LOS: 3 days

## 2011-12-31 NOTE — Op Note (Addendum)
[name] [address] [place], [address]   ENDOSCOPY PROCEDURE REPORT  PATIENT: Andrea Golden, Andrea Golden  MR#: [id number] BIRTHDATE: 1950-06-11 , 61  yrs. old GENDER: Female ENDOSCOPIST: Louis Meckel, MD REFERRED BY:  Ronna Polio, M.D. PROCEDURE DATE:  12/31/2011 PROCEDURE:  EGD w/ biopsy ASA CLASS:     Class II INDICATIONS:  periumbilical abdominal pain. MEDICATIONS: These medications were titrated to patient response per physician's verbal order, Versed 5 mg IV, Fentanyl 75 mcg IV, and Robinul 0.2 mg IV TOPICAL ANESTHETIC:  DESCRIPTION OF PROCEDURE: After the risks benefits and alternatives of the procedure were thoroughly explained, informed consent was obtained.  The Pentax Gastroscope [id number] endoscope was introduced through the mouth and advanced to the third portion of the duodenum. Without limitations.  The instrument was slowly withdrawn as the mucosa was fully examined.      There was moderate erythema in the duodenal bulb.  Biopsies were taken.  In the gastric body there also areas of erythema.   The remainder of the upper endoscopy exam was otherwise normal. Retroflexed views revealed no abnormalities.     The scope was then withdrawn from the patient and the procedure completed.  COMPLICATIONS: There were no complications. ENDOSCOPIC IMPRESSION: gastroduodenitis RECOMMENDATIONS: PPI therapy Await biopsy results REPEAT EXAM:  eSigned:  Louis Meckel, MD 01/14/2012 4:09 PM Revised: 01/14/2012 4:09 PM  CC:  Addendum: Biopsies apparantly were lost - not received by pathology.

## 2011-12-31 NOTE — Progress Notes (Signed)
Endoscopy demonstrates a gastroduodenitis. Findings could explain her pain.  Medications #1 continue PPI therapy #2 await biopsy results #3 hyomax as needed for abdominal pain

## 2011-12-31 NOTE — Interval H&P Note (Signed)
History and Physical Interval Note:  12/31/2011 12:53 PM  Andrea Golden  has presented today for surgery, with the diagnosis of Nausea, vomiting, upper abdominal pain  The various methods of treatment have been discussed with the patient and family. After consideration of risks, benefits and other options for treatment, the patient has consented to  Procedure(s) (LRB) with comments: ESOPHAGOGASTRODUODENOSCOPY (EGD) (N/A) as a surgical intervention .  The patient's history has been reviewed, patient examined, no change in status, stable for surgery.  I have reviewed the patient's chart and labs.  Questions were answered to the patient's satisfaction.    The recent H&P (dated *12/30/11**) was reviewed, the patient was examined and there is no change in the patients condition since that H&P was completed.   Melvia Heaps  12/31/2011, 12:53 PM    Melvia Heaps

## 2012-01-01 ENCOUNTER — Encounter (HOSPITAL_COMMUNITY): Payer: Self-pay

## 2012-01-01 ENCOUNTER — Encounter (HOSPITAL_COMMUNITY): Payer: Self-pay | Admitting: Gastroenterology

## 2012-01-01 MED ORDER — HYOSCYAMINE SULFATE 0.125 MG/5ML PO ELIX: 0.1250 mg | ORAL_SOLUTION | Freq: Four times a day (QID) | ORAL | Status: DC | PRN

## 2012-01-01 MED ORDER — PANTOPRAZOLE SODIUM 40 MG PO TBEC: 40.0000 mg | DELAYED_RELEASE_TABLET | Freq: Two times a day (BID) | ORAL | Status: DC

## 2012-01-01 NOTE — Progress Notes (Signed)
Patient under the impression that she has a Gallbladder scan this morning. Dr. Arlyce Dice called to Verify. Dr. Arlyce Dice Stated that she would not have a scan due to EGD results and that she could resume diet. Adele Barthel

## 2012-01-01 NOTE — Progress Notes (Signed)
[facility] Progress Note  SUBJECTIVE: complains of significant loose stool last night. She ate dinner last night but nauseated and didn't want breakfast today. She want to go home OBJECTIVE:  Vital signs in last 24 hours: Temp:  [97.4 F (36.3 C)-98.3 F (36.8 C)] 98.3 F (36.8 C) (11/08 0939) Pulse Rate:  [62-80] 62  (11/08 0939) Resp:  [12-31] 18  (11/08 0939) BP: (83-161)/(38-78) 155/73 mmHg (11/08 0939) SpO2:  [94 %-100 %] 95 % (11/08 0939) Weight:  [160 lb 7.9 oz (72.8 kg)] 160 lb 7.9 oz (72.8 kg) (11/07 2204) Last BM Date: 12/31/11 General:    white female in NAD Heart:  Regular rate and rhythm; no murmurs Abdomen:  Soft, mild diffuse upper abdominal tenderness. Exhibits moderate diffuse lower abdominal tenderness, left >right.  Normal bowel sounds. Neurologic:  Alert and oriented,  grossly normal neurologically. Psych:  Cooperative. Normal mood and affect.   ASSESSMENT / PLAN:  1. Nausea, vomiting, upper abdominal pain with finding of gastoduodenitis on EGD. Biopsies pending. Patient want to go home. Continue daily PPI. I made her a follow up appointment. She knows to call us in the interim for sooner appointment should symptoms worsen.    2. Intermittent loose stools and diffuse lower abdominal pain. This was present prior to admission. CTscan negative for colitis. No diarrhea today (hasn't eaten anything) which raises question of osmotic diarrhea. Patient concerned about gluten sensitivity. Will avoid glutens for now as to not exacerbate her current symptoms.  We can address possibility of gluten sensitivity as outpatient. Doubt celiac given EGD findings.   3.  History of breast cancer, no recurrence on  PET scan last month.     LOS: 4 days   Willette Cluster  01/01/2012, 10:04 AM   I have personally taken an interval history, reviewed the chart, and examined the patient.  I agree with the extender's note, impression and recommendations.  Barbette Hair. Arlyce Dice,  MD, [facility] [phone]

## 2012-01-01 NOTE — Progress Notes (Signed)
AVS reviewed with pt and husband at bedside. Teach back method used. Question answered. Patient remains stable. IV removed. Pt taken via wheelchair to exit of facility. Adele Barthel

## 2012-01-05 ENCOUNTER — Other Ambulatory Visit: Payer: Self-pay | Admitting: Internal Medicine

## 2012-01-05 ENCOUNTER — Telehealth (INDEPENDENT_AMBULATORY_CARE_PROVIDER_SITE_OTHER): Payer: Self-pay

## 2012-01-05 NOTE — Telephone Encounter (Signed)
ok 

## 2012-01-05 NOTE — Telephone Encounter (Signed)
Message copied by Brennan Bailey on Tue Jan 05, 2012 10:58 AM ------      Message from: Mitzi Davenport      Created: Tue Jan 05, 2012  9:58 AM      Regarding: Surgery case Cancel       Patient called and canceled DOS 12.19.13, she has been sick and in the hospital and will be on medications for 6 weeks.  I gave patient the option of rescheduling for January , per patient she will call back to reschedule once she feels better and after she sees her oncologist in January.            Thank you      Loni Muse

## 2012-01-06 ENCOUNTER — Telehealth: Payer: Self-pay | Admitting: Nurse Practitioner

## 2012-01-06 NOTE — Telephone Encounter (Signed)
Patient is calling to report that she is having a severe dry mouth and cough since starting the 2 new prescriptions from Willette Cluster, NP. She is taking Pantoprazole and Hyoscyamine drops. States she is taking sips of water and chewing sugar free gum but she is still having terrible dry mouth. Please, advise.

## 2012-01-07 NOTE — Telephone Encounter (Signed)
Spoke with patient and gave her Willette Cluster, NP recommendations and told patient Willette Cluster, NP did not order this medication.

## 2012-01-07 NOTE — Telephone Encounter (Signed)
Andrea Golden, I don't recall giving this patient any prescriptions, must have been Dr. Arlyce Dice. Anyway, hyoscyamine will dry her out. If she cannot tolerate it then obviously needs to stop it. thanks

## 2012-01-18 ENCOUNTER — Telehealth: Payer: Self-pay | Admitting: Internal Medicine

## 2012-01-18 ENCOUNTER — Encounter: Payer: Self-pay | Admitting: Internal Medicine

## 2012-01-18 NOTE — Telephone Encounter (Signed)
Pt is calling concerning her arthritis. She says she has been talking with you through MyChart but she called and wanted an appointment tomorrow. There was no appointments and she was wondering if anything could be called in to help with her arthritis pain.

## 2012-01-18 NOTE — Telephone Encounter (Signed)
Can you ask if she has ever tried Tramadol? If not we can try Tramadol 50mg  po bid prn pain. #60 no refill.

## 2012-01-19 ENCOUNTER — Other Ambulatory Visit: Payer: Self-pay

## 2012-01-19 ENCOUNTER — Other Ambulatory Visit: Payer: Self-pay | Admitting: Internal Medicine

## 2012-01-19 MED ORDER — TRAMADOL HCL 50 MG PO TABS: ORAL_TABLET | ORAL | Status: DC

## 2012-01-19 NOTE — Telephone Encounter (Signed)
Medication called in 

## 2012-01-26 ENCOUNTER — Encounter: Payer: Self-pay | Admitting: Gastroenterology

## 2012-01-26 ENCOUNTER — Other Ambulatory Visit (INDEPENDENT_AMBULATORY_CARE_PROVIDER_SITE_OTHER): Payer: BC Managed Care – PPO

## 2012-01-26 ENCOUNTER — Ambulatory Visit (INDEPENDENT_AMBULATORY_CARE_PROVIDER_SITE_OTHER): Payer: BC Managed Care – PPO | Admitting: Gastroenterology

## 2012-01-26 VITALS — BP 128/82 | HR 90 | Ht 65.0 in | Wt 163.8 lb

## 2012-01-26 DIAGNOSIS — Z800 Family history of malignant neoplasm of digestive organs: Secondary | ICD-10-CM

## 2012-01-26 DIAGNOSIS — R1013 Epigastric pain: Secondary | ICD-10-CM

## 2012-01-26 DIAGNOSIS — R1011 Right upper quadrant pain: Secondary | ICD-10-CM

## 2012-01-26 DIAGNOSIS — R635 Abnormal weight gain: Secondary | ICD-10-CM

## 2012-01-26 LAB — TSH: TSH: 2.27 u[IU]/mL (ref 0.35–5.50)

## 2012-01-26 LAB — T4: T4, Total: 7.6 ug/dL (ref 5.0–12.5)

## 2012-01-26 NOTE — Assessment & Plan Note (Signed)
Plan colonoscopy in 2016. We'll review prior colonoscopy records

## 2012-01-26 NOTE — Assessment & Plan Note (Addendum)
Epigastric pain has now evolved into right upper quadrant pain. Symptoms are suggestive of chronic cholecystitis despite negative ultrasound and CT scan.  Recommendations #1 HIDA scan

## 2012-01-26 NOTE — Patient Instructions (Addendum)
You have been scheduled for a HIDA scan at [facility] (1st floor) on Dec 30th at 8am. Please arrive 15 minutes prior to your scheduled appointment on 02/22/2012. Make certain not to have anything to eat or drink at least 6 hours prior to your test. Should this appointment date or time not work well for you, please call radiology scheduling at [phone]. NO STOMACH MEDS 6 HOURS BEFORE _____________________________________________________________________ hepatobiliary (HIDA) scan is an imaging procedure used to diagnose problems in the liver, gallbladder and bile ducts. In the HIDA scan, a radioactive chemical or tracer is injected into a vein in your arm. The tracer is handled by the liver like bile. Bile is a fluid produced and excreted by your liver that helps your digestive system break down fats in the foods you eat. Bile is stored in your gallbladder and the gallbladder releases the bile when you eat a meal. A special nuclear medicine scanner (gamma camera) tracks the flow of the tracer from your liver into your gallbladder and small intestine.  During your HIDA scan  You'll be asked to change into a hospital gown before your HIDA scan begins. Your health care team will position you on a table, usually on your back. The radioactive tracer is then injected into a vein in your arm.The tracer travels through your bloodstream to your liver, where it's taken up by the bile-producing cells. The radioactive tracer travels with the bile from your liver into your gallbladder and through your bile ducts to your small intestine.You may feel some pressure while the radioactive tracer is injected into your vein. As you lie on the table, a special gamma camera is positioned over your abdomen taking pictures of the tracer as it moves through your body. The gamma camera takes pictures continually for about an hour. You'll need to keep still during the HIDA scan. This can become uncomfortable, but you may find that you  can lessen the discomfort by taking deep breaths and thinking about other things. Tell your health care team if you're uncomfortable. The radiologist will watch on a computer the progress of the radioactive tracer through your body. The HIDA scan may be stopped when the radioactive tracer is seen in the gallbladder and enters your small intestine. This typically takes about an hour. In some cases extra imaging will be performed if original images aren't satisfactory, if morphine is given to help visualize the gallbladder or if the medication CCK is given to look at the contraction of the gallbladder. This test typically takes 2 hours to complete.  GO TO THE BASEMENT FOR LABS TODAY ________________________________________________________________________

## 2012-01-26 NOTE — Progress Notes (Signed)
History of Present Illness: This is a post hospitalization visit for this 61 year old female with abdominal pain. She was admitted almost one month ago with upper abdominal pain and nausea. Workup including CT and ultrasound were negative. Endoscopy demonstrated a gastroduodenitis. Since discharge she continues to complain of right upper quadrant pain that occurs spontaneously and may radiate through the back. She has awakened with pain and has described the pain as 10 out of 10, at times. It is accompanied by nausea. LFTs have been normal. She's also complaining of weight gain despite eating and only moderately.    Past Medical History  Diagnosis Date  . Cancer 2011    left breast, s/p XRT and chemo, Dr. Kerin Salen  . Fibromyalgia   . 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 Arthritis   . Hypertension   . Depression    Past Surgical History  Procedure Date  . Abdominal hysterectomy 1985    one ovary remains  . Appendectomy   . Tonsillectomy   . Wisdom tooth extraction   . Mohs surgery     , nose  . Breast surgery     partial mastectomy, Dr. Evette Cristal  . Esophagogastroduodenoscopy 12/31/2011    Procedure: ESOPHAGOGASTRODUODENOSCOPY (EGD);  Surgeon: Louis Meckel, MD;  Location: [facility] ENDOSCOPY;  Service: Endoscopy;  Laterality: N/A;  . Tympanoplasty     slit   family history includes ALS in her sister; Cancer in her brothers, father, and mother; and Kidney disease in her mother. Current Outpatient Prescriptions  Medication Sig Dispense Refill  . ALPRAZolam (XANAX) 0.5 MG tablet TAKE 1 TABLET BY MOUTH TWICE A DAY AS NEEDED FOR ANXIETY  60 tablet  2  . amoxicillin (AMOXIL) 500 MG capsule Take 2,000 mg by mouth once. For dental procedure      . Ascorbic Acid (VITAMIN C PO) Take 1 tablet by mouth daily.      Marland Kitchen aspirin 81 MG tablet Take 81 mg by mouth daily.        Marland Kitchen BIOTIN PO Take 1 tablet by mouth daily.      . citalopram (CELEXA) 20 MG tablet TAKE 1 TABLET (20 MG TOTAL) BY MOUTH DAILY.  30  tablet  3  . diclofenac sodium (VOLTAREN) 1 % GEL Apply 1 g topically 4 (four) times daily as needed. To joints as needed for arthritis pain      . EPIPEN 2-PAK 0.3 MG/0.3ML DEVI Inject 0.3 mLs into the muscle as needed.      . hydroxypropyl methylcellulose (ISOPTO TEARS) 2.5 % ophthalmic solution Place 1 drop into both eyes 4 (four) times daily as needed. Dry eyes      . hyoscyamine (LEVSIN) 0.125 MG/5ML ELIX Take 5 mLs (0.125 mg total) by mouth every 6 (six) hours as needed (abdominal pain.cramps).  600 mL  0  . lidocaine (LIDODERM) 5 % Place 1 patch onto the skin daily as needed. Arthritis pain to joints. Remove & Discard patch within 12 hours or as directed by MD      . MAGNESIUM SALICYLATE PO Take 1 tablet by mouth daily.      . metroNIDAZOLE (METROGEL) 1 % gel Apply 1 application topically every morning. To face for roscea      . Multiple Vitamin (MULTIVITAMIN WITH MINERALS) TABS Take 1 tablet by mouth daily.      . pantoprazole (PROTONIX) 40 MG tablet Take 1 tablet (40 mg total) by mouth 2 (two) times daily.  30 tablet  0  .  Probiotic Product (ALIGN) 4 MG CAPS Take 1 capsule by mouth daily.      . traMADol (ULTRAM) 50 MG tablet Take one tablet (50mg ) twice a day as needed for pain.  60 tablet  0  . VOLTAREN 1 % GEL APPLY 1 APPLICATION TOPICALLY AS NEEDED.  100 g  0  . ZOLMitriptan (ZOMIG) 2.5 MG tablet Take 2.5 mg by mouth daily as needed. migraine      . [DISCONTINUED] propranolol (INDERAL LA) 60 MG 24 hr capsule Take 1 capsule (60 mg total) by mouth daily.  30 capsule  3   Allergies as of 01/26/2012 - Review Complete 01/26/2012  Allergen Reaction Noted  . Benadryl (diphenhydramine hcl)  12/10/2010  . Lyrica (pregabalin) Shortness Of Breath 12/10/2010  . Contrast media (iodinated diagnostic agents)  12/12/2010  . Gluten  12/10/2010  . Lidocaine  11/19/2011  . Novocain (procaine hcl) Other (See Comments) 12/28/2011  . Sulfa drugs cross reactors  12/10/2010  . Codeine Rash 12/10/2010   . Topamax (topiramate) Hives and Rash 12/10/2010    reports that she quit smoking about 5 years ago. Her smoking use included Cigarettes. She has never used smokeless tobacco. She reports that she does not drink alcohol or use illicit drugs.     Review of Systems: Pertinent positive and negative review of systems were noted in the above HPI section. All other review of systems were otherwise negative.  Vital signs were reviewed in today's medical record Physical Exam: General: Well developed , well nourished, no acute distress Thyroid is not enlarged by exam. There is minimal tenderness in the right upper quadrant without guarding or rebound. There are no dominant masses organomegaly

## 2012-01-26 NOTE — Assessment & Plan Note (Signed)
Rule out hypothyroidism  Plan to check TSH and T4

## 2012-01-27 ENCOUNTER — Ambulatory Visit (INDEPENDENT_AMBULATORY_CARE_PROVIDER_SITE_OTHER): Payer: BC Managed Care – PPO | Admitting: Internal Medicine

## 2012-01-27 ENCOUNTER — Encounter: Payer: Self-pay | Admitting: Internal Medicine

## 2012-01-27 VITALS — BP 122/80 | HR 84 | Temp 97.9°F | Resp 16 | Wt 162.2 lb

## 2012-01-27 DIAGNOSIS — F418 Other specified anxiety disorders: Secondary | ICD-10-CM

## 2012-01-27 DIAGNOSIS — F341 Dysthymic disorder: Secondary | ICD-10-CM

## 2012-01-27 DIAGNOSIS — M797 Fibromyalgia: Secondary | ICD-10-CM

## 2012-01-27 DIAGNOSIS — IMO0001 Reserved for inherently not codable concepts without codable children: Secondary | ICD-10-CM

## 2012-01-27 DIAGNOSIS — R1013 Epigastric pain: Secondary | ICD-10-CM

## 2012-01-27 MED ORDER — TRAMADOL HCL 50 MG PO TABS: 100.0000 mg | ORAL_TABLET | Freq: Two times a day (BID) | ORAL | Status: DC | PRN

## 2012-01-27 NOTE — Progress Notes (Signed)
Subjective:    Patient ID: Andrea Golden, female    DOB: 1950-10-17, 61 y.o.   MRN: [id number]  HPI 61 year old female with history of breast cancer, fibromyalgia, depression presents for followup. She was recently admitted for evaluation of abdominal pain. She had extensive evaluation including imaging and upper endoscopy. Upper endoscopy showed inflammation consistent with gastritis. She was instructed to stop her nonsteroidal medications. She has had some improvement in her abdominal pain but continues to have some intermittent pain which radiates from her right upper quadrant around to her back. This occurs throughout the day and is worse when she eats. She was recently seen by her GI physician and is scheduled for HIDA scan.  Given that she has been off nonsteroidal medications, she has had significant worsening of her fibromyalgia diffuse pain. She was started on tramadol 50 mg twice daily and she reports significant improvement with this medication. She does note some intermittent headache on this medicine. However, this has been tolerable.  In regards to her history of depression, she reports that she would like to taper off her Celexa. She feels that her mood is much improved after recent hospitalization and extensive evaluation of abnormal mammogram.  Outpatient Encounter Prescriptions as of 01/27/2012  Medication Sig Dispense Refill  . ALPRAZolam (XANAX) 0.5 MG tablet TAKE 1 TABLET BY MOUTH TWICE A DAY AS NEEDED FOR ANXIETY  60 tablet  2  . Ascorbic Acid (VITAMIN C PO) Take 1 tablet by mouth daily.      Marland Kitchen aspirin 81 MG tablet Take 81 mg by mouth daily.        Marland Kitchen BIOTIN PO Take 1 tablet by mouth daily.      . citalopram (CELEXA) 20 MG tablet TAKE 1 TABLET (20 MG TOTAL) BY MOUTH DAILY.  30 tablet  3  . diclofenac sodium (VOLTAREN) 1 % GEL Apply 1 g topically 4 (four) times daily as needed. To joints as needed for arthritis pain      . EPIPEN 2-PAK 0.3 MG/0.3ML DEVI Inject 0.3 mLs into the  muscle as needed.      . hydroxypropyl methylcellulose (ISOPTO TEARS) 2.5 % ophthalmic solution Place 1 drop into both eyes 4 (four) times daily as needed. Dry eyes      . lidocaine (LIDODERM) 5 % Place 1 patch onto the skin daily as needed. Arthritis pain to joints. Remove & Discard patch within 12 hours or as directed by MD      . MAGNESIUM SALICYLATE PO Take 1 tablet by mouth daily.      . metroNIDAZOLE (METROGEL) 1 % gel Apply 1 application topically every morning. To face for roscea      . Multiple Vitamin (MULTIVITAMIN WITH MINERALS) TABS Take 1 tablet by mouth daily.      . pantoprazole (PROTONIX) 40 MG tablet Take 1 tablet (40 mg total) by mouth 2 (two) times daily.  30 tablet  0  . Probiotic Product (ALIGN) 4 MG CAPS Take 1 capsule by mouth daily.      . traMADol (ULTRAM) 50 MG tablet Take 2 tablets (100 mg total) by mouth every 12 (twelve) hours as needed for pain.  360 tablet  2  . ZOLMitriptan (ZOMIG) 2.5 MG tablet Take 2.5 mg by mouth daily as needed. migraine      . [DISCONTINUED] traMADol (ULTRAM) 50 MG tablet Take one tablet (50mg ) twice a day as needed for pain.  60 tablet  0  . amoxicillin (AMOXIL) 500 MG capsule Take  2,000 mg by mouth once. For dental procedure      . [DISCONTINUED] hyoscyamine (LEVSIN) 0.125 MG/5ML ELIX Take 5 mLs (0.125 mg total) by mouth every 6 (six) hours as needed (abdominal pain.cramps).  600 mL  0  . [DISCONTINUED] VOLTAREN 1 % GEL APPLY 1 APPLICATION TOPICALLY AS NEEDED.  100 g  0   BP 122/80  Pulse 84  Temp 97.9 F (36.6 C) (Oral)  Resp 16  Wt 162 lb 4 oz (73.596 kg)  SpO2 94%  Review of Systems  Constitutional: Negative for fever, chills, appetite change, fatigue and unexpected weight change.  HENT: Negative for ear pain, congestion, sore throat, trouble swallowing, neck pain, voice change and sinus pressure.   Eyes: Negative for visual disturbance.  Respiratory: Negative for cough, shortness of breath, wheezing and stridor.   Cardiovascular:  Negative for chest pain, palpitations and leg swelling.  Gastrointestinal: Positive for abdominal pain. Negative for nausea, vomiting, diarrhea, constipation, blood in stool, abdominal distention and anal bleeding.  Genitourinary: Negative for dysuria and flank pain.  Musculoskeletal: Positive for myalgias and arthralgias. Negative for gait problem.  Skin: Negative for color change and rash.  Neurological: Negative for dizziness and headaches.  Hematological: Negative for adenopathy. Does not bruise/bleed easily.  Psychiatric/Behavioral: Negative for suicidal ideas, sleep disturbance and dysphoric mood. The patient is not nervous/anxious.        Objective:   Physical Exam  Constitutional: She is oriented to person, place, and time. She appears well-developed and well-nourished. No distress.  HENT:  Head: Normocephalic and atraumatic.  Right Ear: External ear normal.  Left Ear: External ear normal.  Nose: Nose normal.  Mouth/Throat: Oropharynx is clear and moist. No oropharyngeal exudate.  Eyes: Conjunctivae normal are normal. Pupils are equal, round, and reactive to light. Right eye exhibits no discharge. Left eye exhibits no discharge. No scleral icterus.  Neck: Normal range of motion. Neck supple. No tracheal deviation present. No thyromegaly present.  Cardiovascular: Normal rate, regular rhythm, normal heart sounds and intact distal pulses.  Exam reveals no gallop and no friction rub.   No murmur heard. Pulmonary/Chest: Effort normal and breath sounds normal. No respiratory distress. She has no wheezes. She has no rales. She exhibits no tenderness.  Abdominal: Soft. Bowel sounds are normal. She exhibits no distension and no mass. There is tenderness in the right upper quadrant. There is no rebound and no guarding.  Musculoskeletal: Normal range of motion. She exhibits no edema and no tenderness.  Lymphadenopathy:    She has no cervical adenopathy.  Neurological: She is alert and  oriented to person, place, and time. No cranial nerve deficit. She exhibits normal muscle tone. Coordination normal.  Skin: Skin is warm and dry. No rash noted. She is not diaphoretic. No erythema. No pallor.  Psychiatric: She has a normal mood and affect. Her behavior is normal. Judgment and thought content normal.          Assessment & Plan:

## 2012-01-27 NOTE — Assessment & Plan Note (Signed)
Epigastric and right upper quadrant abdominal pain which is persistent despite stopping nonsteroidal medications. Symptoms are consistent with cholecystitis. Ultrasound was normal. Seen by GI physician who recommended HIDA scan. Exam has been scheduled. Will follow.

## 2012-01-27 NOTE — Assessment & Plan Note (Signed)
Significant improvement in diffuse pain symptoms with use of tramadol. We discussed potentially increasing dose to 100 mg twice daily as needed for pain. Refill given today. Followup in one month or sooner as needed.

## 2012-01-27 NOTE — Assessment & Plan Note (Signed)
Symptoms of depression have improved. Patient will taper her Celexa down to 10 mg daily for the next month. If symptoms continue to be well-controlled plan tapering off this medication. Followup one month

## 2012-01-27 NOTE — Patient Instructions (Signed)
Decreased Celexa to 10mg  (1/2 tablet) daily x 2 weeks, then stop.

## 2012-02-04 NOTE — Discharge Summary (Signed)
Physician Discharge Summary  Patient ID: Andrea Golden MRN: [id number] DOB/AGE: 24-Sep-1950 61 y.o.  Admit date: 12/28/2011 Discharge date: 02/04/2012  Primary Care Physician:  Shelia Media, MD   Discharge Diagnoses:    Gastroduodenitis  Abdominal pain  Fibromyalgia  History of breast cancer in female  Neck pain  Depression with anxiety  Epigastric abdominal pain      Medication List     As of 02/04/2012  4:59 PM    STOP taking these medications         naproxen sodium 220 MG tablet   Commonly known as: ANAPROX      OVER THE COUNTER MEDICATION      TAKE these medications         amoxicillin 500 MG capsule   Commonly known as: AMOXIL   Take 2,000 mg by mouth once. For dental procedure      aspirin 81 MG tablet   Take 81 mg by mouth daily.      BIOTIN PO   Take 1 tablet by mouth daily.      citalopram 20 MG tablet   Commonly known as: CELEXA   TAKE 1 TABLET (20 MG TOTAL) BY MOUTH DAILY.      diclofenac sodium 1 % Gel   Commonly known as: VOLTAREN   Apply 1 g topically 4 (four) times daily as needed. To joints as needed for arthritis pain      EPIPEN 2-PAK 0.3 mg/0.3 mL Devi   Generic drug: EPINEPHrine   Inject 0.3 mLs into the muscle as needed.      hydroxypropyl methylcellulose 2.5 % ophthalmic solution   Commonly known as: ISOPTO TEARS   Place 1 drop into both eyes 4 (four) times daily as needed. Dry eyes      lidocaine 5 %   Commonly known as: LIDODERM   Place 1 patch onto the skin daily as needed. Arthritis pain to joints. Remove & Discard patch within 12 hours or as directed by MD      MAGNESIUM SALICYLATE PO   Take 1 tablet by mouth daily.      metroNIDAZOLE 1 % gel   Commonly known as: METROGEL   Apply 1 application topically every morning. To face for roscea      multivitamin with minerals Tabs   Take 1 tablet by mouth daily.      pantoprazole 40 MG tablet   Commonly known as: PROTONIX   Take 1 tablet (40 mg total) by mouth 2  (two) times daily.      VITAMIN C PO   Take 1 tablet by mouth daily.      ZOLMitriptan 2.5 MG tablet   Commonly known as: ZOMIG   Take 2.5 mg by mouth daily as needed. migraine         Disposition and Follow-up:   GI in 1 week  Consults:  Dr.Kaplan  Significant Diagnostic Studies:  No results found.  Brief H and P: This is a 61 year old female with history of breast cancer in possible remission. She presents today with complaints of epigastric pain that radiates to her back. She states the pain started this morning and has gotten progressively worse. She's had some nausea but no vomiting. She had 5 episodes of diarrhea times this morning, no evidence of bleeding. Pain described as continuous is squeezing, at its worst was 9/10 currently the ER to 3/10. She states she's never had this discomfort before. The patient had a recent episode of "tic  fever" for which she was treated with multiple antibiotics including doxycycline. That was approximately 2 months ago. She states her stomach has not been right since. She's been taking Tagamet. She occasionally takes Naprosyn for pain as needed. Patient denies chest pains. She did receive a GI cocktail in the ER, she states it did not help her epigastric discomfort. She does have lidocaine on her allergy list. She states is none allergy but that usually lidocaine usually does not work for her.  The patient has multiple medical complaints, some are chronic related to her chemotherapy from 2 years ago. She has severe peripheral neuropathy in the hands and feet. She is urinary retention which is getting progressively worse. She has been told that is probably neurogenic bladder. She's had recurrent swelling and pain in the neck since chemotherapy. She has fibromyalgia. She may need a mastectomy as there may be a question of recurrence and patient is not a candidate for chemotherapy given the severity of the neuropathy and other side effects from  chemotherapy. History provided by the patient and her husband was at the bedside.      Hospital Course:   Epigastric abdominal pain and N/V/D  Suspect GERD worsened by NSAID use, +/_ gastroenteritis  Vomiting and diarrhea resolved, still with some nausea and occasional pain  -CT abd with findings c/w GERD and hiatal hernia, abd [place] neg for gall stones, lipase wnl, hgb stable  - will continue supportive care with protonix, and carafate  - advance diet, depending on clinical course today,  EGD with gastroduodenitis, started on PPI BID, tolerating diet , followed by [facility], H/o colonoscopy about 5 years ago, reportedly normal  Improved somewhat with PPI, still has some lower abd pain, will FU closely with [facility] after discharge   Time spent on Discharge:  Signed: Christepher Melchior [facility]  02/04/2012, 4:59 PM

## 2012-02-08 ENCOUNTER — Encounter: Payer: Self-pay | Admitting: Internal Medicine

## 2012-02-08 NOTE — Telephone Encounter (Signed)
FYI

## 2012-02-11 ENCOUNTER — Ambulatory Visit: Admit: 2012-02-11 | Payer: Self-pay | Admitting: Surgery

## 2012-02-11 SURGERY — SIMPLE MASTECTOMY
Anesthesia: General | Site: Breast | Laterality: Left

## 2012-02-16 ENCOUNTER — Other Ambulatory Visit (HOSPITAL_COMMUNITY): Payer: BC Managed Care – PPO

## 2012-02-22 ENCOUNTER — Other Ambulatory Visit (HOSPITAL_COMMUNITY): Payer: BC Managed Care – PPO

## 2012-02-22 ENCOUNTER — Encounter (HOSPITAL_COMMUNITY)
Admission: RE | Admit: 2012-02-22 | Discharge: 2012-02-22 | Disposition: A | Discharge status: Home / Self Care | Payer: BC Managed Care – PPO | Source: Ambulatory Visit | Attending: Gastroenterology | Admitting: Gastroenterology

## 2012-02-22 ENCOUNTER — Encounter (HOSPITAL_COMMUNITY): Payer: Self-pay

## 2012-02-22 DIAGNOSIS — R1011 Right upper quadrant pain: Secondary | ICD-10-CM | POA: Insufficient documentation

## 2012-02-22 MED ORDER — TECHNETIUM TC 99M MEBROFENIN IV KIT
5.5000 | PACK | Freq: Once | INTRAVENOUS | Status: AC | PRN
  Administered 2012-02-22: 5.5 via INTRAVENOUS

## 2012-03-01 ENCOUNTER — Telehealth: Payer: Self-pay | Admitting: Gastroenterology

## 2012-03-01 NOTE — Telephone Encounter (Signed)
Pt called requesting results of HIDA scan. Results discussed with pt and she is aware.

## 2012-03-01 NOTE — Progress Notes (Signed)
Quick Note:  Please inform the patient that HIDA scan was normal . hyomax 0.25mg  s.l. q4h prn Needs OV 3-4 weeks ______

## 2012-03-03 ENCOUNTER — Ambulatory Visit (INDEPENDENT_AMBULATORY_CARE_PROVIDER_SITE_OTHER): Payer: BC Managed Care – PPO | Admitting: Internal Medicine

## 2012-03-03 ENCOUNTER — Encounter: Payer: Self-pay | Admitting: Internal Medicine

## 2012-03-03 VITALS — BP 120/80 | HR 85 | Temp 98.6°F | Ht 65.5 in | Wt 158.5 lb

## 2012-03-03 DIAGNOSIS — R1013 Epigastric pain: Secondary | ICD-10-CM

## 2012-03-03 NOTE — Assessment & Plan Note (Signed)
Persistent symptoms of epigastric, and more prominently right upper quadrant abdominal pain. Symptoms seem most consistent with cholecystis, however [place] normal and HIDA normal. Question if ursodiol would be helpful. Will touch base with her GI physician. Follow up here prn.

## 2012-03-03 NOTE — Progress Notes (Signed)
  Subjective:    Patient ID: Andrea Golden, female    DOB: December 05, 1950, 62 y.o.   MRN: [id number]  HPI 62 year old female with history of breast cancer and recent history of persistent right upper quadrant abdominal pain presents for followup. She reports that she has stopped all of her medications except for fish oil. She continues to have right upper quadrant abdominal pain which is made worse by ingestion of certain foods such as chocolate. Pain is described as aching or occasionally sharp and associated with nausea. She had right upper quadrant ultrasound and HIDA scan both of which were normal. She has also had upper endoscopy which was normal except for mild gastritis.  Outpatient Encounter Prescriptions as of 03/03/2012  Medication Sig Dispense Refill  . Omega-3 Fatty Acids (FISH OIL) 1000 MG CAPS Take 3 capsules by mouth daily.       BP 120/80  Pulse 85  Temp 98.6 F (37 C) (Oral)  Ht 5' 5.5" (1.664 m)  Wt 158 lb 8 oz (71.895 kg)  BMI 25.97 kg/m2  SpO2 97%  Review of Systems  Constitutional: Negative for fever, chills, appetite change, fatigue and unexpected weight change.  HENT: Negative for ear pain, congestion, sore throat, trouble swallowing, neck pain, voice change and sinus pressure.   Eyes: Negative for visual disturbance.  Respiratory: Negative for cough, shortness of breath, wheezing and stridor.   Cardiovascular: Negative for chest pain, palpitations and leg swelling.  Gastrointestinal: Positive for abdominal pain. Negative for nausea, vomiting, diarrhea, constipation, blood in stool, abdominal distention and anal bleeding.  Genitourinary: Negative for dysuria and flank pain.  Musculoskeletal: Negative for myalgias, arthralgias and gait problem.  Skin: Negative for color change and rash.  Neurological: Negative for dizziness and headaches.  Hematological: Negative for adenopathy. Does not bruise/bleed easily.  Psychiatric/Behavioral: Negative for suicidal ideas, sleep  disturbance and dysphoric mood. The patient is not nervous/anxious.        Objective:   Physical Exam  Constitutional: She is oriented to person, place, and time. She appears well-developed and well-nourished. No distress.  HENT:  Head: Normocephalic and atraumatic.  Right Ear: External ear normal.  Left Ear: External ear normal.  Nose: Nose normal.  Mouth/Throat: Oropharynx is clear and moist. No oropharyngeal exudate.  Eyes: Conjunctivae normal are normal. Pupils are equal, round, and reactive to light. Right eye exhibits no discharge. Left eye exhibits no discharge. No scleral icterus.  Neck: Normal range of motion. Neck supple. No tracheal deviation present. No thyromegaly present.  Cardiovascular: Normal rate, regular rhythm, normal heart sounds and intact distal pulses.  Exam reveals no gallop and no friction rub.   No murmur heard. Pulmonary/Chest: Effort normal and breath sounds normal. No respiratory distress. She has no wheezes. She has no rales. She exhibits no tenderness.  Abdominal: Soft. Normal appearance. There is no hepatosplenomegaly. There is tenderness in the right upper quadrant.  Musculoskeletal: Normal range of motion. She exhibits no edema and no tenderness.  Lymphadenopathy:    She has no cervical adenopathy.  Neurological: She is alert and oriented to person, place, and time. No cranial nerve deficit. She exhibits normal muscle tone. Coordination normal.  Skin: Skin is warm and dry. No rash noted. She is not diaphoretic. No erythema. No pallor.  Psychiatric: She has a normal mood and affect. Her behavior is normal. Judgment and thought content normal.          Assessment & Plan:

## 2012-03-04 ENCOUNTER — Telehealth: Payer: Self-pay | Admitting: Gastroenterology

## 2012-03-04 NOTE — Telephone Encounter (Signed)
Discussed with pt that Dr. Arlyce Dice wanted her to schedule an OV to come in and discuss the issue with her gallbladder and talk to her about possibly having her gallbladder removed. Asked pt to schedule the OV and she states she is trying to "detox" her body by stopping all of her pain medications including tylenol and motrin and see if she improves doing this. Pt states she will call us back if this does not work. Dr. Arlyce Dice notified.

## 2012-03-18 ENCOUNTER — Other Ambulatory Visit: Payer: Self-pay | Admitting: *Deleted

## 2012-03-18 DIAGNOSIS — C50919 Malignant neoplasm of unspecified site of unspecified female breast: Secondary | ICD-10-CM

## 2012-03-21 ENCOUNTER — Ambulatory Visit (HOSPITAL_BASED_OUTPATIENT_CLINIC_OR_DEPARTMENT_OTHER): Payer: BC Managed Care – PPO | Admitting: Oncology

## 2012-03-21 ENCOUNTER — Other Ambulatory Visit (HOSPITAL_BASED_OUTPATIENT_CLINIC_OR_DEPARTMENT_OTHER): Payer: BC Managed Care – PPO | Admitting: Lab

## 2012-03-21 ENCOUNTER — Telehealth: Payer: Self-pay | Admitting: Oncology

## 2012-03-21 VITALS — BP 146/72 | HR 93 | Temp 98.3°F | Resp 20 | Ht 65.5 in | Wt 159.1 lb

## 2012-03-21 DIAGNOSIS — Z171 Estrogen receptor negative status [ER-]: Secondary | ICD-10-CM

## 2012-03-21 DIAGNOSIS — C50919 Malignant neoplasm of unspecified site of unspecified female breast: Secondary | ICD-10-CM

## 2012-03-21 LAB — CBC WITH DIFFERENTIAL/PLATELET
Basophils Absolute: 0.1 10*3/uL (ref 0.0–0.1)
EOS%: 13.5 % — ABNORMAL HIGH (ref 0.0–7.0)
Eosinophils Absolute: 1.1 10*3/uL — ABNORMAL HIGH (ref 0.0–0.5)
HGB: 13.9 g/dL (ref 11.6–15.9)
MCH: 33.4 pg (ref 25.1–34.0)
NEUT#: 4.8 10*3/uL (ref 1.5–6.5)
RBC: 4.16 10*6/uL (ref 3.70–5.45)
RDW: 12.8 % (ref 11.2–14.5)
lymph#: 2 10*3/uL (ref 0.9–3.3)

## 2012-03-21 LAB — COMPREHENSIVE METABOLIC PANEL (CC13)
ALT: 21 U/L (ref 0–55)
CO2: 24 mEq/L (ref 22–29)
Sodium: 139 mEq/L (ref 136–145)
Total Bilirubin: 0.42 mg/dL (ref 0.20–1.20)
Total Protein: 8.1 g/dL (ref 6.4–8.3)

## 2012-03-21 NOTE — Progress Notes (Signed)
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 She had subsequent breast radiation. (ADDENDUM: the patient's records from [place] were received after her initial visit and that information was incorporated in the "Assessment" section below)  On 10/16/2011 the patient had routine diagnostic mammography at [facility], showing in addition to post surgical and post radiation changes in the left breast, a possible new density in the retroareolar portion of the left breast. Additional mammographic views on August 27 found no definite abnormality, but ultrasound the same day confirmed an echodense left-sided retroareolar density. The patient was then referred to the breast Center for further elevation, and biopsy of the area in question was obtained 11/19/2011. This showed (SAA [id number]) of benign breast parenchyma with dense fibrosis. There was some fat necrosis present. There was no atypia or malignancy identified. Her subsequent history is as detailed below.  INTERVAL HISTORY: Kaytlin returns today with her husband Casimiro Needle for followup of her breast cancer. Since her last visit here she developed some right upper quadrant discomfort which was evaluated appropriately without a specific cause being found. She stopped all medications and that problem has resolved. She has not resumed any of her medications. She has also made a decision against bilateral mastectomies.  REVIEW OF SYSTEMS: She has sleep problems and describes herself is fatigued. She has a G. pains related to her prior breast surgery. Her eyes are dry. She  has some gum problems. She has arthritis in several joints, and she continues to have numbness in her legs from the knees down. Otherwise a detailed review of systems was noncontributory today  PAST MEDICAL HISTORY: Past Medical History  Diagnosis Date  . Cancer 2011    left breast, s/p XRT and chemo, Dr. Kerin Salen  . Fibromyalgia   . 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 Arthritis   . Hypertension   . Depression     PAST SURGICAL HISTORY: Past Surgical History  Procedure Date  . Abdominal hysterectomy 1985    one ovary remains  . Appendectomy   . Tonsillectomy   . Wisdom tooth extraction   . Mohs surgery     [place], nose  . Breast surgery     partial mastectomy, Dr. Evette Cristal  . Esophagogastroduodenoscopy 64/08/2011    Procedure: ESOPHAGOGASTRODUODENOSCOPY (EGD);  Surgeon: Louis Meckel, MD;  Location: [facility] ENDOSCOPY;  Service: Endoscopy;  Laterality: N/A;  . Tympanoplasty     slit    FAMILY HISTORY Family History  Problem Relation Age of Onset  . Cancer Mother     melanoma, uterine  . Kidney disease Mother   . Cancer Father     lung  . ALS Sister   . Cancer Brother     colon  . Cancer Brother     leukemia/brain tumor   the patient's father died from lung cancer at the age of 71. This was less than a year after his diagnosis. The patient's mother died at the age of 60 from renal failure. The patient had 4 brothers and 2 sisters. One brother was diagnosed with colon cancer at the age of 68, and survives. One brother  died June 2013 at the age of 62 from "brain cancer". He also had "blood cancer". The patient has one sister who died from ALS. There is no history of breast or ovarian cancer in the family.  GYNECOLOGIC HISTORY: Menarche age 62, she is GX P2, first live birth age 62, hysterectomy with USO in 68. She used hormone replacement for about 6 years, with no side effects that she is aware of and particularly no clotting issues.  SOCIAL HISTORY: (updated October 2013) She  used to work in [employer], but is now retired. Her husband of 33 years, Chante Mayson, used to work in the [employer] in [place], but is now retired. Daughter Sue Lush lives in [place] and is a homemaker. Son Fayrene Fearing lives in [place] and is disabled secondary to bipolar disease. The patient has 4 grandsons. She attends a [employer]   ADVANCED DIRECTIVES: Not in place  HEALTH MAINTENANCE: History  Substance Use Topics  . Smoking status: Former Smoker    Types: Cigarettes    Quit date: 12/10/2006  . Smokeless tobacco: Never Used  . Alcohol Use: No     Colonoscopy: 2009  PAP: 2009  Bone density: 2009/ "normal"  Lipid panel:  Allergies  Allergen Reactions  . Benadryl (Diphenhydramine Hcl)     Stops kidneys  . Lyrica (Pregabalin) Shortness Of Breath  . Contrast Media (Iodinated Diagnostic Agents)     ALLERGIC TO GADOLINIUM  . Gluten   . Lidocaine     Lidocaine does NOT numb patient.  Used Nesacaine 1% (prolocaine)  . Novocain (Procaine Hcl) Other (See Comments)    Does not work  . Sulfa Drugs Cross Reactors     Doesn't remember  . Codeine Rash  . Topamax (Topiramate) Hives and Rash    Current Outpatient Prescriptions  Medication Sig Dispense Refill  . Omega-3 Fatty Acids (FISH OIL) 1000 MG CAPS Take 3 capsules by mouth daily.      . [DISCONTINUED] propranolol (INDERAL LA) 60 MG 24 hr capsule Take 1 capsule (60 mg total) by mouth daily.  30 capsule  3    OBJECTIVE: 62-aged white woman in no acute distress Filed Vitals:   03/21/12 1139  BP: 146/72  Pulse: 93  Temp: 98.3 F (36.8 C)  Resp: 20     Body mass index is 26.07 kg/(m^2).    ECOG FS: 0  Sclerae unicteric Oropharynx clear No cervical or supraclavicular adenopathy Lungs no rales or rhonchi Heart regular rate and rhythm Abd benign MSK no focal spinal tenderness, no peripheral edema Neuro: nonfocal Breasts: The left breast is status post prior lumpectomy and radiation. There is mild  tenderness to palpation, but no erythema or swelling. There is no evidence of local recurrence. The left axilla is clear. The right breast is unremarkable   LAB RESULTS: Lab Results  Component Value Date   WBC 8.5 03/21/2012   NEUTROABS 4.8 03/21/2012   HGB 13.9 03/21/2012   HCT 40.4 03/21/2012   MCV 97.2 03/21/2012   PLT 249 03/21/2012      Chemistry      Component Value Date/Time   NA 137 12/29/2011 0240   NA 138 12/08/2011 1612   NA 139 10/16/2011 1622   K 3.9 12/29/2011 0240   K 3.7 12/08/2011 1612   CL 103 12/29/2011 0240   CL 104 12/08/2011 1612   CO2 26 12/29/2011 0240   CO2 24 12/08/2011 1612   BUN 10 12/29/2011 0240   BUN 13.0 12/08/2011  1612   BUN 15 10/16/2011 1622   CREATININE 0.72 12/29/2011 0240   CREATININE 0.8 12/08/2011 1612      Component Value Date/Time   CALCIUM 8.5 12/29/2011 0240   CALCIUM 10.2 12/08/2011 1612   ALKPHOS 66 12/29/2011 0240   ALKPHOS 77 12/08/2011 1612   AST 22 12/29/2011 0240   AST 28 12/08/2011 1612   ALT 18 12/29/2011 0240   ALT 30 12/08/2011 1612   BILITOT 0.3 12/29/2011 0240   BILITOT 0.20 12/08/2011 1612       No results found for this basename: LABCA2    No components found with this basename: [id number]    No results found for this basename: INR:1;PROTIME:1 in the last 168 hours  Urinalysis    Component Value Date/Time   COLORURINE YELLOW 12/28/2011 2123    STUDIES: [place] Core Biopsy  12/10/2011  **ADDENDUM** CREATED: 11/20/2011 11:58:47  Pathology revealed benign breast parenchyma with dense fibrosis, hemosiderin laden macrophages, and fat necrosis in the left breast. This was found to be concordant by Dr. Norva Pavlov. Pathology was relayed by telephone. The patient reported doing well after the biopsy. Post biopsy instructions were reviewed and her questions were answered. She was encouraged to call [facility] for any additional concerns. She was asked to return in August of 2014 for bilateral  diagnostic mammography.  Pathology results are dictated by Sonnie Alamo RN, BSN on November 20, 2011.  **END ADDENDUM** SIGNED BY: Sherian Rein, M.D.   12/10/2011  **ADDENDUM** CREATED: 11/20/2011 11:58:47  Pathology revealed benign breast parenchyma with dense fibrosis, hemosiderin laden macrophages, and fat necrosis in the left breast. This was found to be concordant by Dr. Norva Pavlov. Pathology was relayed by telephone. The patient reported doing well after the biopsy. Post biopsy instructions were reviewed and her questions were answered. She was encouraged to call [facility] for any additional concerns. She was asked to return in August of 2014 for bilateral diagnostic mammography.  Pathology results are dictated by Sonnie Alamo RN, BSN on November 20, 2011.  **END ADDENDUM** SIGNED BY: Sherian Rein, M.D.   Nm Hepato W/eject Fract  02/22/2012  *RADIOLOGY REPORT*  Clinical Data:  Nausea, vomiting.  Right upper quadrant pain.  NUCLEAR MEDICINE HEPATOBILIARY IMAGING WITH GALLBLADDER EF  Technique: Sequential images of the abdomen were obtained out to 60 minutes following intravenous administration of radiopharmaceutical.  After ingestion of a fatty meal, gallbladder ejection fraction was determined.  Radiopharmaceutical:  5.22mCi Tc-36m Choletec  Comparison: CT and ultrasound 12/28/2011.  Findings: There is prompt uptake and excretion of radiotracer by the liver.  No evidence of cystic duct or common duct obstruction. Gallbladder ejection fraction is 75%.  Normal is greater than 33% with Ensure ingestion.  The patient did not experience symptoms following ingestion of fatty meal.  IMPRESSION: Normal study.   Original Report Authenticated By: Charlett Nose, M.D.      ASSESSMENT: 62 y.o. Adline Peals woman   (1) s/p left lumpectomy and sentinel lymph node dissection October 22, 2009 for s 3.7 cm invasive ductal carcinoma grade 3, with 0 of 3 sentinel lymph nodes involved,  estrogen and progesterone receptor negative, HER-2 2+ by immunohistochenmistry but FISH negative, with an initially positive margin cleared by additional resection November 19, 2009  (2) completed 4 cycles of AC with dose reductions due to neutropenic fever and colitis;  (3) received weekly taxol x 6 with multiple dose reductions and delays due to  neuropathy and other symptoms; last chemotherapy dose 05/01/2010  (4) radiation to the left breast completed 08/13/2010  (4) benigh left breast biopsy 11/19/2011  PLAN: Nargis had decided to have bilateral mastectomies with reconstruction, but she learned that she would need a latissimus flap on the left, and "I need my back muscles". She understands she can have bilateral mastectomies without reconstruction, and simply use per cc, but that idea is not attractive to her. At this point she prefers to keep her breasts and continue with mammographic screening, understanding that she does have somewhat dense breast tissue and that that does decrease mammographic sensitivity.   She sees Dr. Dan Humphreys in December, it would make more sense if we saw her in June or July and so spread out to visits a little bit better. Since she usually gets mammography in August, we are going to see her again in September. The overall plan is to follow her for a total of 5 years. She understands that triple negative patients like her if they have not recurred within 5 years generally have a good prognosis and can be released to her primary care physician at that time.  Lynelle Weiler C    03/21/2012

## 2012-03-21 NOTE — Telephone Encounter (Signed)
appts made and printed for pt aom °

## 2012-03-24 ENCOUNTER — Encounter (INDEPENDENT_AMBULATORY_CARE_PROVIDER_SITE_OTHER): Payer: Self-pay

## 2012-05-31 ENCOUNTER — Encounter: Payer: Self-pay | Admitting: Adult Health

## 2012-05-31 ENCOUNTER — Ambulatory Visit (INDEPENDENT_AMBULATORY_CARE_PROVIDER_SITE_OTHER): Payer: BC Managed Care – PPO | Admitting: Adult Health

## 2012-05-31 VITALS — BP 139/79 | HR 100 | Temp 98.2°F | Resp 18 | Ht 65.0 in | Wt 162.0 lb

## 2012-05-31 DIAGNOSIS — R5381 Other malaise: Secondary | ICD-10-CM

## 2012-05-31 DIAGNOSIS — G629 Polyneuropathy, unspecified: Secondary | ICD-10-CM | POA: Insufficient documentation

## 2012-05-31 DIAGNOSIS — R209 Unspecified disturbances of skin sensation: Secondary | ICD-10-CM

## 2012-05-31 DIAGNOSIS — G589 Mononeuropathy, unspecified: Secondary | ICD-10-CM

## 2012-05-31 DIAGNOSIS — R519 Headache, unspecified: Secondary | ICD-10-CM | POA: Insufficient documentation

## 2012-05-31 DIAGNOSIS — R5383 Other fatigue: Secondary | ICD-10-CM | POA: Insufficient documentation

## 2012-05-31 DIAGNOSIS — R202 Paresthesia of skin: Secondary | ICD-10-CM

## 2012-05-31 DIAGNOSIS — R51 Headache: Secondary | ICD-10-CM

## 2012-05-31 LAB — COMPREHENSIVE METABOLIC PANEL
BUN: 13 mg/dL (ref 6–23)
CO2: 23 mEq/L (ref 19–32)
Calcium: 9.8 mg/dL (ref 8.4–10.5)
Creatinine, Ser: 0.7 mg/dL (ref 0.4–1.2)
GFR: 85.4 mL/min (ref >60.00)
Glucose, Bld: 85 mg/dL (ref 70–99)
Total Bilirubin: 0.5 mg/dL (ref 0.3–1.2)

## 2012-05-31 LAB — CBC WITH DIFFERENTIAL/PLATELET
Eosinophils Relative: 6.1 % — ABNORMAL HIGH (ref 0.0–5.0)
HCT: 41 % (ref 36.0–46.0)
Hemoglobin: 14 g/dL (ref 12.0–15.0)
Lymphocytes Relative: 22 % (ref 12.0–46.0)
Lymphs Abs: 1.4 10*3/uL (ref 0.7–4.0)
Monocytes Relative: 8 % (ref 3.0–12.0)
Neutro Abs: 4.2 10*3/uL (ref 1.4–7.7)
WBC: 6.6 10*3/uL (ref 4.5–10.5)

## 2012-05-31 NOTE — Assessment & Plan Note (Signed)
Persistent HA. No cognitive deficits appreciated. Will check head CT to r/o any acute changes that may be contributing to daily HA.

## 2012-05-31 NOTE — Progress Notes (Signed)
  Subjective:    Patient ID: Andrea Golden, female    DOB: [birth year], 62 y.o.   MRN: [id number]  HPI  Patient is a pleasant 62 y/o female with hx of HTN, colitis, stage II invasive ductal carcinoma of the left breast (2011) s/p chemo (cyclophosphamide, doxorubicin, taxol)  interrupted 2/2 neuropathy. She is also s/p radiation. Patient presents to clinic with progressive fatigue & weakness, HA "all the time", lightheadedness with changing position and feeling like she is going to pass out, chest tightness and shortness of breath with activity such as walking from her car to clinic. She also reports that her neuropathy of hands and feet are worse.    Review of Systems  Constitutional: Positive for fatigue.  HENT: Negative for congestion, postnasal drip and sinus pressure.   Respiratory: Positive for chest tightness and shortness of breath. Negative for cough and wheezing.   Cardiovascular: Negative for chest pain and leg swelling.       Rapid heart rate with minimal activity  Gastrointestinal: Positive for blood in stool. Negative for constipation.       She occasionally sees blood after BM  Neurological: Positive for syncope, weakness, light-headedness, numbness and headaches.  Psychiatric/Behavioral: Negative for behavioral problems.   BP 139/79  Pulse 100  Temp(Src) 98.2 F (36.8 C) (Oral)  Resp 18  Ht 5\' 5"  (1.651 m)  Wt 162 lb (73.483 kg)  BMI 26.96 kg/m2  SpO2 9%     Objective:   Physical Exam  Constitutional: She is oriented to person, place, and time. She appears well-developed and well-nourished. No distress.  HENT:  Head: Normocephalic and atraumatic.  Right Ear: External ear normal.  Left Ear: External ear normal.  Eyes: Conjunctivae and EOM are normal. Pupils are equal, round, and reactive to light.  Cardiovascular: Regular rhythm, S1 normal, S2 normal, normal heart sounds and intact distal pulses.  Tachycardia present.  Exam reveals no S3 and no S4.    Pulmonary/Chest: Effort normal and breath sounds normal. No respiratory distress. She has no wheezes. She has no rales.  Musculoskeletal: Normal range of motion. She exhibits no edema and no tenderness.  Neurological: She is alert and oriented to person, place, and time. She displays abnormal reflex. A sensory deficit is present.  Reflex Scores:      Brachioradialis reflexes are 2+ on the right side and 2+ on the left side.      Patellar reflexes are 0 on the right side and 2+ on the left side.      Achilles reflexes are 0 on the right side and 0 on the left side. Sensation to sharp and soft altered bilateral hands and feet.  Skin: Skin is warm and dry.  Psychiatric: She has a normal mood and affect. Her behavior is normal. Judgment and thought content normal.        Assessment & Plan:

## 2012-05-31 NOTE — Assessment & Plan Note (Signed)
Symptoms worse. Alteration in sensation. Patient not able to distinguish between sharp and soft tough on hands and feet bilaterally. Discussed medication to alleviate symptoms. She has tried lyrica in the past with significant SE (swelling throat). Discussed the use of Cymbalta which has been helpful in treating pain and neuropathy. She will think about this.

## 2012-05-31 NOTE — Assessment & Plan Note (Addendum)
Progressing fatigue with symptoms worse with activity. Tachycardic. Chest tightness. ECG done and was normal. 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 She has reported seeing blood in stool and when she wipes after BM. Will check cbc to r/o anemia. TSH recently done in January. Will also check a metabolic panel. May benefit from echo to r/o any cardiac effects from chemo/radiation. Note, greater than 30 minutes were spent in face to face communication with patient regarding problems listed in counseling and developing a plan of care.

## 2012-05-31 NOTE — Patient Instructions (Signed)
  Please have your labs drawn prior to leaving the office.  Amber will schedule your CT of the head.

## 2012-06-02 ENCOUNTER — Ambulatory Visit: Payer: Self-pay | Admitting: Internal Medicine

## 2012-06-03 ENCOUNTER — Telehealth: Payer: Self-pay | Admitting: Adult Health

## 2012-06-03 NOTE — Telephone Encounter (Signed)
Head CT normal

## 2012-06-06 ENCOUNTER — Telehealth: Payer: Self-pay | Admitting: General Practice

## 2012-06-06 NOTE — Telephone Encounter (Signed)
Message copied by Jackson Latino on Mon Jun 06, 2012  9:56 AM ------      Message from: Novella Olive      Created: Fri Jun 03, 2012  7:11 PM       I sent patient a MyChart message that her head CT was normal. Can you confirm that I sent this correctly? I cannot seem to locate the message.            Thanks,      Raquel ------

## 2012-06-06 NOTE — Telephone Encounter (Signed)
Pt advised of CT results, she had read message on MyChart.

## 2012-06-30 ENCOUNTER — Encounter: Payer: Self-pay | Admitting: Internal Medicine

## 2012-09-12 ENCOUNTER — Other Ambulatory Visit: Payer: Self-pay | Admitting: Oncology

## 2012-09-12 DIAGNOSIS — Z853 Personal history of malignant neoplasm of breast: Secondary | ICD-10-CM

## 2012-11-14 ENCOUNTER — Ambulatory Visit (HOSPITAL_BASED_OUTPATIENT_CLINIC_OR_DEPARTMENT_OTHER): Payer: BC Managed Care – PPO | Admitting: Lab

## 2012-11-14 ENCOUNTER — Ambulatory Visit (HOSPITAL_BASED_OUTPATIENT_CLINIC_OR_DEPARTMENT_OTHER): Payer: BC Managed Care – PPO | Admitting: Physician Assistant

## 2012-11-14 ENCOUNTER — Encounter: Payer: Self-pay | Admitting: Physician Assistant

## 2012-11-14 ENCOUNTER — Telehealth: Payer: Self-pay | Admitting: *Deleted

## 2012-11-14 VITALS — BP 152/75 | HR 80 | Temp 98.6°F | Resp 18 | Ht 65.0 in | Wt 164.0 lb

## 2012-11-14 DIAGNOSIS — C50912 Malignant neoplasm of unspecified site of left female breast: Secondary | ICD-10-CM

## 2012-11-14 DIAGNOSIS — C50919 Malignant neoplasm of unspecified site of unspecified female breast: Secondary | ICD-10-CM

## 2012-11-14 DIAGNOSIS — M797 Fibromyalgia: Secondary | ICD-10-CM

## 2012-11-14 DIAGNOSIS — R300 Dysuria: Secondary | ICD-10-CM

## 2012-11-14 DIAGNOSIS — IMO0001 Reserved for inherently not codable concepts without codable children: Secondary | ICD-10-CM

## 2012-11-14 LAB — CBC WITH DIFFERENTIAL/PLATELET
BASO%: 0.7 % (ref 0.0–2.0)
Basophils Absolute: 0 10*3/uL (ref 0.0–0.1)
EOS%: 6.9 % (ref 0.0–7.0)
Eosinophils Absolute: 0.5 10*3/uL (ref 0.0–0.5)
HCT: 40.2 % (ref 34.8–46.6)
HGB: 13.8 g/dL (ref 11.6–15.9)
LYMPH%: 23.9 % (ref 14.0–49.7)
MCH: 33.3 pg (ref 25.1–34.0)
MCHC: 34.3 g/dL (ref 31.5–36.0)
MCV: 97 fL (ref 79.5–101.0)
MONO%: 8.1 % (ref 0.0–14.0)
NEUT%: 60.4 % (ref 38.4–76.8)
Platelets: 256 10*3/uL (ref 145–400)
lymph#: 1.6 10*3/uL (ref 0.9–3.3)

## 2012-11-14 LAB — COMPREHENSIVE METABOLIC PANEL (CC13)
AST: 40 U/L — ABNORMAL HIGH (ref 5–34)
BUN: 11.9 mg/dL (ref 7.0–26.0)
Calcium: 9.9 mg/dL (ref 8.4–10.4)
Chloride: 104 mEq/L (ref 98–109)
Creatinine: 0.8 mg/dL (ref 0.6–1.1)
Glucose: 88 mg/dl (ref 70–140)
Total Bilirubin: 0.48 mg/dL (ref 0.20–1.20)

## 2012-11-14 LAB — URINALYSIS, MICROSCOPIC - CHCC
Blood: NEGATIVE
Ketones: NEGATIVE mg/dL
Protein: NEGATIVE mg/dL
RBC / HPF: NEGATIVE (ref 0–2)
Specific Gravity, Urine: 1.005 (ref 1.003–1.035)

## 2012-11-14 NOTE — Telephone Encounter (Signed)
appts made and printed. Pt request not to have to make 2 trips in April. gv April appts for the same day...td

## 2012-11-14 NOTE — Progress Notes (Signed)
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 She had subsequent breast radiation. (ADDENDUM: the patient's records from [place] were received after her initial visit and that information was incorporated in the "Assessment" section below)  On 10/16/2011 the patient had routine diagnostic mammography at [facility], showing in addition to post surgical and post radiation changes in the left breast, a possible new density in the retroareolar portion of the left breast. Additional mammographic views on August 27 found no definite abnormality, but ultrasound the same day confirmed an echodense left-sided retroareolar density. The patient was then referred to the breast Center for further elevation, and biopsy of the area in question was obtained 11/19/2011. This showed ([id number] [id number]) of benign breast parenchyma with dense fibrosis. There was some fat necrosis present. There was no atypia or malignancy identified. Her subsequent history is as detailed below.  INTERVAL HISTORY: Andrea Golden returns alone today  for followup of her left breast cancer. She's currently followed with observation alone, and with regards to her breast cancer is feeling well.  Andrea Golden's energy level is good. She continues to exercise every day. In fact, her only complaint today is some recent dysuria, primarily for the past 3-4 days. She denies any fevers and has noted no hematuria.  REVIEW OF SYSTEMS: She has had no recent illnesses and denies any fevers, chills, or night sweats. She does have hot flashes which occasionally wakes her up at  night. She's had no vaginal bleeding or significant vaginal dryness. She also denies any abnormal bruising or abnormal bleeding elsewhere. Her appetite is good. She has occasional episodes of queasiness, but no emesis. She's having regular bowel movements. She's had no cough, increased shortness of breath, chest pain, or palpitations. She's had no abnormal headaches or dizziness and denies any recent change in vision. She does have some joint pain she attributes to arthritis but denies any new or unusual myalgias or bony pain. She's had no peripheral swelling. Her ankle sometimes feel weak when she is walking, but she denies any injuries or falls.  A detailed review of systems is otherwise stable and noncontributory.  PAST MEDICAL HISTORY: Past Medical History  Diagnosis Date  . Cancer 2011    left breast, s/p XRT and chemo, Dr. Kerin Salen  . Fibromyalgia   . 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 Arthritis   . Hypertension   . Depression     PAST SURGICAL HISTORY: Past Surgical History  Procedure Laterality Date  . Abdominal hysterectomy  1985    one ovary remains  . Appendectomy    . Tonsillectomy    . Wisdom tooth extraction    . Mohs surgery      [place], nose  . Breast surgery      partial mastectomy, Dr. Evette Cristal  . Esophagogastroduodenoscopy  12/31/2011    Procedure: ESOPHAGOGASTRODUODENOSCOPY (EGD);  Surgeon: Louis Meckel, MD;  Location: [facility] ENDOSCOPY;  Service: Endoscopy;  Laterality: N/A;  . Tympanoplasty      slit    FAMILY HISTORY Family History  Problem Relation Age of Onset  . Cancer Mother     melanoma, uterine  .  Kidney disease Mother   . Cancer Father     lung  . ALS Sister   . Cancer Brother     colon  . Cancer Brother     leukemia/brain tumor   the patient's father died from lung cancer at the age of 59. This was less than a year after his diagnosis. The patient's mother died at the age of 70 from renal failure. The patient had 4 brothers and 2 sisters. One brother  was diagnosed with colon cancer at the age of 41, and survives. One brother died 06/22/2013at the age of 35 from "brain cancer". He also had "blood cancer". The patient has one sister who died from ALS. There is no history of breast or ovarian cancer in the family.  GYNECOLOGIC HISTORY: Menarche age 69, she is GX P2, first live birth age 72, hysterectomy with USO in 63. She used hormone replacement for about 6 years, with no side effects that she is aware of and particularly no clotting issues.  SOCIAL HISTORY: (updated October 2013) She used to work in [employer], but is now retired. Her husband of 33 years, Ma Munoz, used to work in the [employer] in [place], but is now retired. Daughter Sue Lush lives in [place] and is a homemaker. Son Fayrene Fearing lives in [place]  and is disabled secondary to bipolar disease. The patient has 4 grandsons. She attends a [employer].  ADVANCED DIRECTIVES: Not in place  HEALTH MAINTENANCE: History  Substance Use Topics  . Smoking status: Former Smoker    Types: Cigarettes    Quit date: 12/10/2006  . Smokeless tobacco: Never Used  . Alcohol Use: No     Colonoscopy: 2009  Upper Endoscopy: Nov 2013  PAP: 2009  Bone density: 2009/ "normal"  Lipid panel:   Allergies  Allergen Reactions  . Benadryl [Diphenhydramine Hcl]     Stops kidneys  . Lyrica [Pregabalin] Shortness Of Breath  . Contrast Media [Iodinated Diagnostic Agents]     ALLERGIC TO GADOLINIUM  . Gluten   . Lidocaine     Lidocaine does NOT numb patient.  Used Nesacaine 1% (prolocaine)  . Novocain [Procaine Hcl] Other (See Comments)    Does not work  . Sulfa Drugs Cross Reactors     Doesn't remember  . Codeine Rash  . Topamax [Topiramate] Hives and Rash    Current Outpatient Prescriptions  Medication Sig Dispense Refill  . aspirin 81 MG tablet Take 81 mg by mouth daily.      . B Complex-C (B-COMPLEX WITH VITAMIN C) tablet Take 1 tablet by mouth daily.      Marland Kitchen  GLUTATHIONE PO Take 500 mg by mouth daily.      Chilton Si Coffee Bean 400 MG CAPS Take 400 mg by mouth daily.      . Multiple Vitamin (MULTIVITAMIN) tablet Take 1 tablet by mouth daily.      . Probiotic Product (ALIGN PO) Take by mouth.      . ranitidine (ZANTAC) 150 MG capsule Take 150 mg by mouth daily.      Marland Kitchen amoxicillin (AMOXIL) 500 MG capsule       . [DISCONTINUED] propranolol (INDERAL LA) 60 MG 24 hr capsule Take 1 capsule (60 mg total) by mouth daily.  30 capsule  3   No current facility-administered medications for this visit.    OBJECTIVE: Middle-aged white Golden in no acute distress Filed Vitals:   11/14/12 1045  BP: 152/75  Pulse: 80  Temp: 98.6 F (37 C)  Resp: 18     Body mass index is 27.29 kg/(m^2).    ECOG FS: 0  Filed Weights   11/14/12 1045  Weight: 164 lb (74.39 kg)   Physical Exam: HEENT:  Sclerae anicteric.  Oropharynx clear. Buccal mucosa is pink and moist NODES:  No cervical or supraclavicular lymphadenopathy palpated.  BREAST EXAM:  Right breast is unremarkable. Left breast is status post lumpectomy, with absence of the areolar complex. There no suspicious masses or skin changes, no evidence of local recurrence.   Axillae a benign bilaterally, no adenopathy palpated  LUNGS:  Clear to auscultation bilaterally.  No wheezes or rhonchi HEART:  Regular rate and rhythm. No murmur  appreciated.  ABDOMEN:  Soft, nontender.  Positive bowel sounds.  MSK:  No focal spinal tenderness to palpation.  EXTREMITIES:  No peripheral edema.   NEURO:  Nonfocal. Well oriented.  Friendly affect.    LAB RESULTS: Lab Results  Component Value Date   WBC 6.6 05/31/2012   NEUTROABS 4.2 05/31/2012   HGB 14.0 05/31/2012   HCT 41.0 05/31/2012   MCV 97.8 05/31/2012   PLT 261.0 05/31/2012      Chemistry      Component Value Date/Time   NA 137 05/31/2012 1110   NA 139 03/21/2012 1130   NA 139 10/16/2011 1622   K 4.3 05/31/2012 1110   K 4.0 03/21/2012 1130   CL 102 05/31/2012 1110   CL 103  03/21/2012 1130   CO2 23 05/31/2012 1110   CO2 24 03/21/2012 1130   BUN 13 05/31/2012 1110   BUN 14.4 03/21/2012 1130   BUN 15 10/16/2011 1622   CREATININE 0.7 05/31/2012 1110   CREATININE 0.8 03/21/2012 1130      Component Value Date/Time   CALCIUM 9.8 05/31/2012 1110   CALCIUM 10.1 03/21/2012 1130   ALKPHOS 69 05/31/2012 1110   ALKPHOS 82 03/21/2012 1130   AST 32 05/31/2012 1110   AST 20 03/21/2012 1130   ALT 31 05/31/2012 1110   ALT 21 03/21/2012 1130   BILITOT 0.5 05/31/2012 1110   BILITOT 0.42 03/21/2012 1130      STUDIES:  Bilateral mammogram is scheduled for 11/21/2012.     ASSESSMENT: 62 y.o. Andrea Golden   (1) s/p left lumpectomy and sentinel lymph node dissection October 22, 2009 for s 3.7 cm invasive ductal carcinoma grade 3, with 0 of 3 sentinel lymph nodes involved, estrogen and progesterone receptor negative, HER-2 2+ by immunohistochenmistry but FISH negative, with an initially positive margin cleared by additional resection November 19, 2009  (2) completed 4 cycles of AC with dose reductions due to neutropenic fever and colitis;  (3) received weekly taxol x 6 with multiple dose reductions and delays due to neuropathy and other symptoms; last chemotherapy dose 05/01/2010  (4) radiation to the left breast completed 08/13/2010  (5) benigh left breast biopsy 11/19/2011  PLAN: With regards to her breast cancer, Andrea Golden appears to be doing well, with no clinical evidence of disease recurrence at this time. She is scheduled for her annual mammogram next week. We will then see her back in April for repeat labs and physical exam. Of course today as she leaves we'll also obtain labs, including a CBC and metabolic panel, in addition to a urinalysis and urine culture to evaluate her dysuria and a likely urinary tract infection.   Atonya had decided to have bilateral mastectomies with reconstruction, but she learned that she would need a  latissimus flap on the left, and "I need my back  muscles". She understands she can have bilateral mastectomies without reconstruction, and simply use per cc, but that idea is not attractive to her. At this point she prefers to keep her breasts and continue with mammographic screening, understanding that she does have somewhat dense breast tissue and that that does decrease mammographic sensitivity.   The plan at this time is to continue following her for a total of 5 years. All of this was reviewed in detail with Andrea Golden, and she voices understanding and agreement with our plan. She knows to call with any changes or problems prior to her next appointment here.   Andrea Golden    11/14/2012

## 2012-11-15 LAB — URINE CULTURE

## 2012-11-17 ENCOUNTER — Telehealth: Payer: Self-pay | Admitting: *Deleted

## 2012-11-17 NOTE — Telephone Encounter (Signed)
This RN called to pt post review of labs and pt's concern.  Informed pt u/a and CX looked good and recommended follow up with PCP for any additional concerns.  This RN also discussed noted 1x elevation in AST- with request to recheck in November.  Per discussion pt stated she has been taking additional supplements and she will hold on these until recheck, she also stated " I wonder if they are what is making my stomach hurt as well ".  No other needs at this time.

## 2012-11-17 NOTE — Telephone Encounter (Signed)
Pt called for results of U/A obtained at visit with AB/PA on Monday.  This RN reviewed no growth in culture.  Pt is still has noted symptoms with kidney/bladder and urination.  This RN will review this inquiry and lab results with AB/PA and return call to patient.

## 2012-11-21 ENCOUNTER — Ambulatory Visit
Admission: RE | Admit: 2012-11-21 | Discharge: 2012-11-21 | Disposition: A | Discharge status: Home / Self Care | Payer: BC Managed Care – PPO | Source: Ambulatory Visit | Attending: Oncology | Admitting: Oncology

## 2012-11-21 DIAGNOSIS — Z853 Personal history of malignant neoplasm of breast: Secondary | ICD-10-CM

## 2012-11-22 ENCOUNTER — Other Ambulatory Visit: Payer: Self-pay | Admitting: *Deleted

## 2012-11-22 ENCOUNTER — Other Ambulatory Visit: Payer: Self-pay | Admitting: Oncology

## 2012-11-22 DIAGNOSIS — Z853 Personal history of malignant neoplasm of breast: Secondary | ICD-10-CM

## 2012-11-24 ENCOUNTER — Telehealth: Payer: Self-pay | Admitting: *Deleted

## 2012-11-24 NOTE — Telephone Encounter (Signed)
Lm gv appt for labs on 01/11/13 @ 3:30pm. Made pt aware that i will mail a letter/avs...td

## 2013-01-11 ENCOUNTER — Other Ambulatory Visit (HOSPITAL_BASED_OUTPATIENT_CLINIC_OR_DEPARTMENT_OTHER): Payer: BC Managed Care – PPO | Admitting: Lab

## 2013-01-11 DIAGNOSIS — C50919 Malignant neoplasm of unspecified site of unspecified female breast: Secondary | ICD-10-CM

## 2013-01-11 LAB — CBC WITH DIFFERENTIAL/PLATELET
BASO%: 0.7 % (ref 0.0–2.0)
Basophils Absolute: 0.1 10*3/uL (ref 0.0–0.1)
EOS%: 9.9 % — ABNORMAL HIGH (ref 0.0–7.0)
HCT: 38.1 % (ref 34.8–46.6)
HGB: 12.8 g/dL (ref 11.6–15.9)
LYMPH%: 23.9 % (ref 14.0–49.7)
MCH: 33 pg (ref 25.1–34.0)
MCHC: 33.7 g/dL (ref 31.5–36.0)
MCV: 98 fL (ref 79.5–101.0)
MONO%: 6.7 % (ref 0.0–14.0)
NEUT%: 58.8 % (ref 38.4–76.8)
RDW: 12.6 % (ref 11.2–14.5)
lymph#: 2 10*3/uL (ref 0.9–3.3)

## 2013-01-11 LAB — COMPREHENSIVE METABOLIC PANEL (CC13)
ALT: 25 U/L (ref 0–55)
AST: 25 U/L (ref 5–34)
Albumin: 4 g/dL (ref 3.5–5.0)
Alkaline Phosphatase: 71 U/L (ref 40–150)
Anion Gap: 10 mEq/L (ref 3–11)
BUN: 15.6 mg/dL (ref 7.0–26.0)
CO2: 26 mEq/L (ref 22–29)
Calcium: 9.9 mg/dL (ref 8.4–10.4)
Chloride: 105 mEq/L (ref 98–109)
Creatinine: 0.8 mg/dL (ref 0.6–1.1)
Glucose: 118 mg/dl (ref 70–140)
Potassium: 4.2 mEq/L (ref 3.5–5.1)
Sodium: 141 mEq/L (ref 136–145)
Total Bilirubin: 0.24 mg/dL (ref 0.20–1.20)
Total Protein: 7.6 g/dL (ref 6.4–8.3)

## 2013-02-20 ENCOUNTER — Ambulatory Visit (INDEPENDENT_AMBULATORY_CARE_PROVIDER_SITE_OTHER): Payer: BC Managed Care – PPO | Admitting: Family Medicine

## 2013-02-20 ENCOUNTER — Encounter: Payer: Self-pay | Admitting: Family Medicine

## 2013-02-20 ENCOUNTER — Telehealth: Payer: Self-pay | Admitting: Internal Medicine

## 2013-02-20 VITALS — BP 142/72 | HR 99 | Temp 98.5°F | Wt 163.2 lb

## 2013-02-20 DIAGNOSIS — R05 Cough: Secondary | ICD-10-CM

## 2013-02-20 DIAGNOSIS — J019 Acute sinusitis, unspecified: Secondary | ICD-10-CM

## 2013-02-20 DIAGNOSIS — R059 Cough, unspecified: Secondary | ICD-10-CM

## 2013-02-20 LAB — POCT INFLUENZA A/B: Influenza B, POC: NEGATIVE

## 2013-02-20 MED ORDER — AMOXICILLIN-POT CLAVULANATE 875-125 MG PO TABS: 1.0000 | ORAL_TABLET | Freq: Two times a day (BID) | ORAL | Status: DC

## 2013-02-20 NOTE — Assessment & Plan Note (Signed)
Nontoxic, start augmentin and f/u prn.  Supportive care o/w.  dw pt.  Okay for outpatient f/u.

## 2013-02-20 NOTE — Progress Notes (Signed)
Pre-visit discussion using our clinic review tool. No additional management support is needed unless otherwise documented below in the visit note.  Sx started about 5 days ago.  ST initially, since then with fatigue, hoarse voice. Rhinorrhea.  tmax >99 but <100.  Cough with discolored sputum.  In the last 2 days, "I feel like I've been run over."  Chest is sore likely from coughing, worse with cough.  No vomiting.  B ear pain.  No diarrhea.  No rash.   Taking mucinex D with some relief.    Meds, vitals, and allergies reviewed.   ROS: See HPI.  Otherwise, noncontributory.  GEN: nad, alert and oriented HEENT: mucous membranes moist, tm w/o erythema, nasal exam w/o erythema, clear discharge noted,  OP with cobblestoning, sinuses ttp x4 NECK: supple w/o LA CV: rrr.   PULM: ctab, no inc wob EXT: no edema SKIN: no acute rash

## 2013-02-20 NOTE — Telephone Encounter (Signed)
FYI

## 2013-02-20 NOTE — Telephone Encounter (Signed)
Patient Information:  Caller Name: Tifini  Phone: [phone]  Patient: Andrea Golden, Andrea Golden  Gender: Female  DOB: 1950/04/25  Age: 62 Years  PCP: Ronna Polio (Adults only)  Office Follow Up:  Does the office need to follow up with this patient?: No  Instructions For The Office: N/A   Symptoms  Reason For Call & Symptoms: Pt is calling and states that she started with scratchy throat and sx seem to be getting worse; increase in mucus and low grade fever now; having bodyaches and chills; throat and ears are hurting; coughing up  dark yellow to brown mucus;  Reviewed Health History In EMR: Yes  Reviewed Medications In EMR: Yes  Reviewed Allergies In EMR: Yes  Reviewed Surgeries / Procedures: Yes  Date of Onset of Symptoms: 02/15/2013  Treatments Tried: Mucinex and Guifenisen  Treatments Tried Worked: No  Any Fever: Yes  Fever Taken: Oral  Fever Time Of Reading: 08:00:00  Fever Last Reading: 99.4  Guideline(s) Used:  Influenza - Seasonal  Disposition Per Guideline:   See Today in Office  Reason For Disposition Reached:   [place]  Advice Given:  Treating the Symptoms of Flu  Sore Throat: Use throat lozenges, hard candy or warm chicken broth.  Hydrate: Drink extra liquids. If the air in your home is dry, use a humidifier.  Call Back If:  You become short of breath or worse.  Patient Will Follow Care Advice:  YES  Appt made for 2:30pm today with Dr Para March at [facility] due to no other appt available. Pt will comply

## 2013-02-20 NOTE — Patient Instructions (Signed)
Start the augmentin today.  Drink plenty of fluids, take tylenol as needed, and gargle with warm salt water for your throat if needed.  This should gradually improve.  Take care.  Let us know if you have other concerns.

## 2013-02-28 ENCOUNTER — Telehealth: Payer: Self-pay | Admitting: Internal Medicine

## 2013-02-28 MED ORDER — FLUCONAZOLE 150 MG PO TABS: 150.0000 mg | ORAL_TABLET | Freq: Once | ORAL | Status: DC

## 2013-02-28 NOTE — Telephone Encounter (Signed)
Pt called she saw dr Damita Dunnings on 12/29 and has been taken an antibiotic 2 x day for 10 days.  She wants to know if you can call her something in for a yeast infection [facility] Please advise

## 2013-02-28 NOTE — Telephone Encounter (Signed)
Please read below and advise.

## 2013-02-28 NOTE — Telephone Encounter (Signed)
Fine to call in Diflucan 150mg po x 1 

## 2013-02-28 NOTE — Telephone Encounter (Signed)
Medication sent to the pharmacy.

## 2013-06-05 ENCOUNTER — Telehealth: Payer: Self-pay | Admitting: Oncology

## 2013-06-05 ENCOUNTER — Ambulatory Visit (HOSPITAL_BASED_OUTPATIENT_CLINIC_OR_DEPARTMENT_OTHER): Payer: BC Managed Care – PPO | Admitting: Oncology

## 2013-06-05 ENCOUNTER — Other Ambulatory Visit (HOSPITAL_BASED_OUTPATIENT_CLINIC_OR_DEPARTMENT_OTHER): Payer: BC Managed Care – PPO

## 2013-06-05 VITALS — BP 125/78 | HR 88 | Temp 98.1°F | Resp 18 | Ht 65.0 in | Wt 161.1 lb

## 2013-06-05 DIAGNOSIS — Z806 Family history of leukemia: Secondary | ICD-10-CM

## 2013-06-05 DIAGNOSIS — C50919 Malignant neoplasm of unspecified site of unspecified female breast: Secondary | ICD-10-CM

## 2013-06-05 DIAGNOSIS — M797 Fibromyalgia: Secondary | ICD-10-CM

## 2013-06-05 DIAGNOSIS — R52 Pain, unspecified: Secondary | ICD-10-CM

## 2013-06-05 DIAGNOSIS — IMO0001 Reserved for inherently not codable concepts without codable children: Secondary | ICD-10-CM

## 2013-06-05 DIAGNOSIS — F418 Other specified anxiety disorders: Secondary | ICD-10-CM

## 2013-06-05 DIAGNOSIS — Z800 Family history of malignant neoplasm of digestive organs: Secondary | ICD-10-CM

## 2013-06-05 DIAGNOSIS — Z801 Family history of malignant neoplasm of trachea, bronchus and lung: Secondary | ICD-10-CM

## 2013-06-05 DIAGNOSIS — Z171 Estrogen receptor negative status [ER-]: Secondary | ICD-10-CM

## 2013-06-05 DIAGNOSIS — C50912 Malignant neoplasm of unspecified site of left female breast: Secondary | ICD-10-CM

## 2013-06-05 LAB — CBC WITH DIFFERENTIAL/PLATELET
BASO%: 0.8 % (ref 0.0–2.0)
Basophils Absolute: 0.1 10*3/uL (ref 0.0–0.1)
EOS%: 11.2 % — AB (ref 0.0–7.0)
Eosinophils Absolute: 0.9 10*3/uL — ABNORMAL HIGH (ref 0.0–0.5)
HCT: 38.1 % (ref 34.8–46.6)
HGB: 13 g/dL (ref 11.6–15.9)
LYMPH%: 27.4 % (ref 14.0–49.7)
MCH: 33.2 pg (ref 25.1–34.0)
MCHC: 34 g/dL (ref 31.5–36.0)
MCV: 97.5 fL (ref 79.5–101.0)
MONO#: 0.6 10*3/uL (ref 0.1–0.9)
MONO%: 6.8 % (ref 0.0–14.0)
NEUT#: 4.5 10*3/uL (ref 1.5–6.5)
NEUT%: 53.8 % (ref 38.4–76.8)
Platelets: 258 10*3/uL (ref 145–400)
RBC: 3.91 10*6/uL (ref 3.70–5.45)
RDW: 12.4 % (ref 11.2–14.5)
WBC: 8.3 10*3/uL (ref 3.9–10.3)
lymph#: 2.3 10*3/uL (ref 0.9–3.3)

## 2013-06-05 LAB — COMPREHENSIVE METABOLIC PANEL (CC13)
ALK PHOS: 72 U/L (ref 40–150)
ALT: 19 U/L (ref 0–55)
AST: 22 U/L (ref 5–34)
Albumin: 4.3 g/dL (ref 3.5–5.0)
Anion Gap: 9 mEq/L (ref 3–11)
BILIRUBIN TOTAL: 0.27 mg/dL (ref 0.20–1.20)
BUN: 10.6 mg/dL (ref 7.0–26.0)
CO2: 25 mEq/L (ref 22–29)
Calcium: 10 mg/dL (ref 8.4–10.4)
Chloride: 103 mEq/L (ref 98–109)
Creatinine: 0.8 mg/dL (ref 0.6–1.1)
Glucose: 86 mg/dl (ref 70–140)
POTASSIUM: 4 meq/L (ref 3.5–5.1)
Sodium: 138 mEq/L (ref 136–145)
TOTAL PROTEIN: 7.7 g/dL (ref 6.4–8.3)

## 2013-06-05 MED ORDER — GABAPENTIN 100 MG PO CAPS: 100.0000 mg | ORAL_CAPSULE | Freq: Three times a day (TID) | ORAL | Status: DC | PRN

## 2013-06-05 NOTE — Progress Notes (Signed)
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 She had subsequent breast radiation. (ADDENDUM: the patient's records from [facility] were received after her initial visit and that information was incorporated in the "Assessment" section below)  On 10/16/2011 the patient had routine diagnostic mammography at [facility], showing in addition to post surgical and post radiation changes in the left breast, a possible new density in the retroareolar portion of the left breast. Additional mammographic views on August 27 found no definite abnormality, but ultrasound the same day confirmed an echodense left-sided retroareolar density. The patient was then referred to the breast Center for further elevation, and biopsy of the area in question was obtained 11/19/2011. This showed ([id number]) benign breast parenchyma with dense fibrosis. There was some fat necrosis present. There was no atypia or malignancy identified.   Her subsequent history is as detailed below.  INTERVAL HISTORY: Andrea Golden returns today  for followup of her left breast cancer accompanied by her husband Andrea Golden. The interval history is generally unremarkable as far as breast cancer is concerned. She and mic have recently started taking Andrea Golden lessons  ROS: Due to "has not felt good in a while". She tells me her peripheral neuropathy is terrible. Her toes sometimes turn blue. She has pain in the right forearm, deep in the bone. She can't use this fifth and fourth digits of her left hand. Sometimes she gets up from a sitting position and she feels like she might  fall down. Of course she has significant migraines. These have not increased in frequency or intensity. She is nauseated all the time, although she hasn't vomited. She has reflux symptoms. She is constipated, but there has been no blood in her stool and no tarry looking stools. It is hard for her to urinate at times. She feels she'll and has pain "all over". She has a runny nose and sinus symptoms. She is short of breath particularly when climbing stairs. She has pain in her abdomen at times. She feels weak and most days she has to rest. Otherwise detailed review of systems today was stable  PAST MEDICAL HISTORY: Past Medical History  Diagnosis Date  . Cancer 2011    left breast, s/p XRT and chemo, Dr. Lucretia Golden  . Fibromyalgia   . 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 Arthritis   . Hypertension   . Depression     PAST SURGICAL HISTORY: Past Surgical History  Procedure Laterality Date  . Abdominal hysterectomy  1985    one ovary remains  . Appendectomy    . Tonsillectomy    . Wisdom tooth extraction    . Mohs surgery      [place], nose  . Breast surgery      partial mastectomy, Andrea Golden  . Esophagogastroduodenoscopy  12/31/2011    Procedure: ESOPHAGOGASTRODUODENOSCOPY (EGD);  Surgeon: Andrea Castle, MD;  Location: [facility];  Service: Endoscopy;  Laterality: N/A;  . Tympanoplasty      slit    FAMILY HISTORY Family History  Problem Relation Age of Onset  . Cancer Mother     melanoma, uterine  . Kidney disease Mother   .  Cancer Father     lung  . ALS Sister   . Cancer Brother     colon  . Cancer Brother     leukemia/brain tumor   the patient's father died from lung cancer at the age of 40. This was less than a year after his diagnosis. The patient's mother died at the age of 62 from renal failure. The patient had 4 brothers and 2 sisters. One brother was diagnosed with colon cancer at the age of 92, and survives. One brother died 2011/08/27 at the age of 63 from "brain cancer".  He also had "blood cancer". The patient has one sister who died from [place]. There is no history of breast or ovarian cancer in the family.  GYNECOLOGIC HISTORY: Menarche age 21, she is GX P2, first live birth age 64, hysterectomy with USO in 56. She used hormone replacement for about 6 years, with no side effects that she is aware of and particularly no clotting issues.  SOCIAL HISTORY: (updated October 2013) She used to work in [employer], but is now retired. Her husband of 34 years, Andrea Golden, used to work in the [employer] in [place], but is now retired. Daughter Andrea Golden lives in [place] and is a homemaker. Son Andrea Golden lives in [place] and is disabled secondary to bipolar disease. The patient has 4 grandsons. She attends a [facility].  ADVANCED DIRECTIVES: Not in place  HEALTH MAINTENANCE: History  Substance Use Topics  . Smoking status: Former Smoker    Types: Cigarettes    Quit date: 12/10/2006  . Smokeless tobacco: Never Used  . Alcohol Use: No     Colonoscopy: 2009  Upper Endoscopy: Nov 2013  PAP: 2009  Bone density: 2009/ "normal"  Lipid panel:   Allergies  Allergen Reactions  . Benadryl [Diphenhydramine Hcl]     Stops kidneys  . Lyrica [Pregabalin] Shortness Of Breath  . Contrast Media [Iodinated Diagnostic Agents]     ALLERGIC TO GADOLINIUM  . Gluten   . Lidocaine     Lidocaine does NOT numb patient.  Used Nesacaine 1% (prolocaine)  . Novocain [Procaine Hcl] Other (See Comments)    Does not work  . Sulfa Drugs Cross Reactors     Doesn't remember  . Codeine Rash  . Topamax [Topiramate] Hives and Rash    Current Outpatient Prescriptions  Medication Sig Dispense Refill  . amoxicillin (AMOXIL) 500 MG capsule       . amoxicillin-clavulanate (AUGMENTIN) 875-125 MG per tablet Take 1 tablet by mouth 2 (two) times daily.  20 tablet  0  . aspirin 81 MG tablet Take 81 mg by mouth daily.      . B Complex-C (B-COMPLEX WITH VITAMIN C) tablet  Take 1 tablet by mouth daily.      . fluconazole (DIFLUCAN) 150 MG tablet Take 1 tablet (150 mg total) by mouth once.  1 tablet  0  . guaiFENesin (MUCINEX) 600 MG 12 hr tablet Take 600 mg by mouth 2 (two) times daily.      . Menthol (COUGH DROPS) 10 MG LOZG Use as directed in the mouth or throat.      . Multiple Vitamin (MULTIVITAMIN) tablet Take 1 tablet by mouth daily.      . Probiotic Product (ALIGN PO) Take by mouth.      . pseudoephedrine-guaifenesin (MUCINEX D) 60-600 MG per tablet Take 1 tablet by mouth every 12 (twelve) hours.      . ranitidine (ZANTAC) 150  MG capsule Take 150 mg by mouth daily.      . [DISCONTINUED] propranolol (INDERAL LA) 60 MG 24 hr capsule Take 1 capsule (60 mg total) by mouth daily.  30 capsule  3   No current facility-administered medications for this visit.    OBJECTIVE: Middle-aged white woman who appears stated age 63 Vitals:   06/05/13 1422  BP: 125/78  Pulse: 88  Temp: 98.1 F (36.7 C)  Resp: 18     Body mass index is 26.81 kg/(m^2).    ECOG FS: 1  Filed Weights   06/05/13 1422  Weight: 161 lb 1.6 oz (73.074 kg)   Sclerae unicteric, pupils equal and reactive, EOMs intact Oropharynx clear and moist-- teeth in good repair No cervical or supraclavicular adenopathy Lungs no rales or rhonchi Heart regular rate and rhythm Abd soft, nontender, positive bowel sounds MSK tenderness to percussion in the midthoracic spine, no upper extremity lymphedema Neuro: nonfocal, well oriented, appropriate affect Breasts: The right breast is unremarkable. The left breast status post lumpectomy. There is no evidence of local recurrence. The nipple areolar complex is absent. The left axilla is benign  LAB RESULTS: Lab Results  Component Value Date   WBC 8.3 06/05/2013   NEUTROABS 4.5 06/05/2013   HGB 13.0 06/05/2013   HCT 38.1 06/05/2013   MCV 97.5 06/05/2013   PLT 258 06/05/2013      Chemistry      Component Value Date/Time   NA 141 01/11/2013 1538   NA  137 05/31/2012 1110   NA 139 10/16/2011 1622   K 4.2 01/11/2013 1538   K 4.3 05/31/2012 1110   CL 102 05/31/2012 1110   CL 103 03/21/2012 1130   CO2 26 01/11/2013 1538   CO2 23 05/31/2012 1110   BUN 15.6 01/11/2013 1538   BUN 13 05/31/2012 1110   BUN 15 10/16/2011 1622   CREATININE 0.8 01/11/2013 1538   CREATININE 0.7 05/31/2012 1110      Component Value Date/Time   CALCIUM 9.9 01/11/2013 1538   CALCIUM 9.8 05/31/2012 1110   ALKPHOS 71 01/11/2013 1538   ALKPHOS 69 05/31/2012 1110   AST 25 01/11/2013 1538   AST 32 05/31/2012 1110   ALT 25 01/11/2013 1538   ALT 31 05/31/2012 1110   BILITOT 0.24 01/11/2013 1538   BILITOT 0.5 05/31/2012 1110      STUDIES:   Clinical Data: History of left breast cancer status post  lumpectomy 2010.  DIGITAL DIAGNOSTIC BILATERAL MAMMOGRAM WITH CAD  Comparison: With priors  Findings:  ACR Breast Density Category c: The breast tissue is  heterogeneously dense, which may obscure small masses.  There are stable lumpectomy changes in the left breast. There is  no suspicious mass or malignant-type microcalcifications in either  breast.  Mammographic images were processed with CAD.  IMPRESSION:  No evidence of malignancy in either breast.  RECOMMENDATION:  Diagnostic bilateral mammogram in 1 year is recommended.  I have discussed the findings and recommendations with the patient.  Results were also provided in writing at the conclusion of the  visit. If applicable, a reminder letter will be sent to the  patient regarding her next appointment.  BI-RADS CATEGORY 2: Benign finding(s).  Original Report Authenticated By: Lillia Mountain, M.D.       ASSESSMENT: 63 y.o. Fern[place] Parkins woman   (1) s/p left lumpectomy and sentinel lymph node dissection October 22, 2009 for a pT2 pN0, stage IIA invasive ductal carcinoma grade 3, estrogen and progesterone receptor negative,  HER-2 negative by FISH, with an initially positive margin cleared by additional resection November 19, 2009  (2) completed 4 cycles of doxorubicin/ cyclophosphamide with dose reductions due to neutropenic fever and colitis;  (3) received weekly paclitaxel x 6 with multiple dose reductions and delays due to neuropathy and other symptoms; last chemotherapy dose 05/01/2010  (4) radiation to the left breast completed 08/13/2010  (5) benigh left breast biopsy 11/19/2011  (6) peripheral neuropathy  PLAN: Delecia appears to be doing well as far as her breast cancer history is concerned. I think the aches and pains she is having in various places are going to be due to fibromyalgia and not to breast cancer recurrence, but just in case we're going to obtain a bone scan to make sure. She will call for those results.  I do not know if her fibromyalgia is due to her Taxol treatments or not. Apparently those were interrupted several times because of fibromyalgia. In any case she tells me she has never taken anything for it so I am starting her on gabapentin 100 mg to take 3 times a day as needed. She will let us know if that works or not.  Otherwise she will return to see me in October of this year and I will see her again October of next year at which time she will "graduate" from followup here  Nicholl has a good understanding of the overall plan. She agrees with that. She knows to call for any problems that may develop before her next visit here.    Virgie Dad Magrinat    06/05/2013

## 2013-06-05 NOTE — Telephone Encounter (Signed)
gv pt appt schedule for oct. central will call pt w/bone scan appt. per pt request application for hanicap sticker taken to desk nurse (Val) to complete. pt will come back for form when she returns for bone scan next wk.

## 2013-06-05 NOTE — Telephone Encounter (Signed)
gv pt appt schedule for oct.  °

## 2013-06-13 ENCOUNTER — Ambulatory Visit (HOSPITAL_COMMUNITY)
Admission: RE | Admit: 2013-06-13 | Discharge: 2013-06-13 | Disposition: A | Discharge status: Home / Self Care | Payer: BC Managed Care – PPO | Source: Ambulatory Visit | Attending: Oncology | Admitting: Oncology

## 2013-06-13 ENCOUNTER — Encounter (HOSPITAL_COMMUNITY)
Admission: RE | Admit: 2013-06-13 | Discharge: 2013-06-13 | Disposition: A | Discharge status: Home / Self Care | Payer: BC Managed Care – PPO | Source: Ambulatory Visit | Attending: Oncology | Admitting: Oncology

## 2013-06-13 DIAGNOSIS — Z800 Family history of malignant neoplasm of digestive organs: Secondary | ICD-10-CM

## 2013-06-13 DIAGNOSIS — M797 Fibromyalgia: Secondary | ICD-10-CM

## 2013-06-13 DIAGNOSIS — F418 Other specified anxiety disorders: Secondary | ICD-10-CM

## 2013-06-13 DIAGNOSIS — C50919 Malignant neoplasm of unspecified site of unspecified female breast: Secondary | ICD-10-CM

## 2013-06-13 MED ORDER — TECHNETIUM TC 99M MEDRONATE IV KIT
25.9000 | PACK | Freq: Once | INTRAVENOUS | Status: AC | PRN
  Administered 2013-06-13: 25.9 via INTRAVENOUS

## 2013-06-15 ENCOUNTER — Telehealth: Payer: Self-pay

## 2013-06-15 NOTE — Telephone Encounter (Signed)
Per imaging study below - let patient know "Negative exam. No evidence of metastatic disease."  Pt voiced uderstanding.    CLINICAL DATA: Breast cancer.  EXAM:  NUCLEAR MEDICINE WHOLE BODY BONE SCAN  TECHNIQUE:  Whole body anterior and posterior images were obtained approximately  3 hours after intravenous injection of radiopharmaceutical.  RADIOPHARMACEUTICALS: 25.9 Technetium-99 MDP  COMPARISON: NM PET IMAGE RESTAG (PS) SKULL BASE TO THIGH dated  12/22/2011  FINDINGS:  Bilateral renal function and excretion present. No focal bony  abnormality. No evidence of metastatic disease.  IMPRESSION:  Negative exam. No evidence of metastatic disease.  Electronically Signed  By: Marcello Moores Register  On: 06/13/2013 14:06

## 2013-06-22 ENCOUNTER — Other Ambulatory Visit: Payer: Self-pay | Admitting: Internal Medicine

## 2013-06-22 ENCOUNTER — Telehealth: Payer: Self-pay | Admitting: *Deleted

## 2013-06-22 NOTE — Telephone Encounter (Signed)
Needs 30min visit.

## 2013-06-22 NOTE — Telephone Encounter (Signed)
Refill Request  Lidoderm 5% Patch  Apply one patch daily-- 12 hours on then 12 hours off

## 2013-06-22 NOTE — Telephone Encounter (Signed)
Seen in acute visit 4/14, last visit 1/14, refill or appt first?

## 2013-06-22 NOTE — Telephone Encounter (Signed)
Seen in acute visit 4/14, last visit 1/14, refill or appt first? Not on current med list

## 2013-07-05 ENCOUNTER — Ambulatory Visit (INDEPENDENT_AMBULATORY_CARE_PROVIDER_SITE_OTHER): Payer: BC Managed Care – PPO | Admitting: Adult Health

## 2013-07-05 ENCOUNTER — Encounter: Payer: Self-pay | Admitting: Adult Health

## 2013-07-05 VITALS — BP 136/78 | HR 90 | Temp 97.9°F | Resp 14 | Wt 163.8 lb

## 2013-07-05 DIAGNOSIS — R5383 Other fatigue: Secondary | ICD-10-CM

## 2013-07-05 DIAGNOSIS — R0602 Shortness of breath: Secondary | ICD-10-CM

## 2013-07-05 DIAGNOSIS — R5381 Other malaise: Secondary | ICD-10-CM

## 2013-07-05 NOTE — Progress Notes (Signed)
Subjective:    Patient ID: Andrea Golden, female    DOB: Jul 04, 1950, 63 y.o.   MRN: [id number]  HPI Pt is a 63 y/o patient which hx of fibromyalgia, arthritis who presents to clinic with worsening fatigue in the last couple of days. She reports no energy to even walk. She has recently started gabapentin for neuropathy. She has been taking this for 1 month. She recently returned from vacation and reports she walked on the beach and did other fun things but easily recovered from that. She reports getting back and doing some household chores but nothing to make her feel the way she has. She reports shortness of breath when she walks. "feels like I am walking up hill but I am on a level surface". Husband is with her during clinic and he reports that it takes her longer to recover. Recent lab without evidence of anemia.   Note, pt with hx of breast cancer s/p chemo (cyclophosphamide, doxorubicin, taxol) and radiation ~ 3 years ago. She denies having echo.    Past Medical History  Diagnosis Date  . Cancer 2011    left breast, s/p XRT and chemo, Dr. Lucretia Kern  . Fibromyalgia   . 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 Arthritis   . Hypertension   . Depression     Current Outpatient Prescriptions on File Prior to Visit  Medication Sig Dispense Refill  . amoxicillin (AMOXIL) 500 MG capsule       . Ascorbic Acid (VITAMIN C) 100 MG tablet Take 100 mg by mouth daily.      Marland Kitchen aspirin 81 MG tablet Take 81 mg by mouth daily.      . B Complex-C (B-COMPLEX WITH VITAMIN C) tablet Take 1 tablet by mouth daily.      . cetirizine (ZYRTEC) 10 MG tablet Take 10 mg by mouth daily.      Marland Kitchen gabapentin (NEURONTIN) 100 MG capsule Take 1 capsule (100 mg total) by mouth 3 (three) times daily as needed.  90 capsule  12  . metroNIDAZOLE (METROGEL) 1 % gel Apply topically daily.      . Probiotic Product (ALIGN PO) Take by mouth.      . Propylene Glycol (SYSTANE BALANCE OP) Apply to eye.      . ranitidine (ZANTAC) 150 MG capsule  Take 150 mg by mouth daily.      . sodium chloride (OCEAN) 0.65 % SOLN nasal spray Place 1 spray into both nostrils as needed for congestion.      . [DISCONTINUED] propranolol (INDERAL LA) 60 MG 24 hr capsule Take 1 capsule (60 mg total) by mouth daily.  30 capsule  3   No current facility-administered medications on file prior to visit.      Review of Systems  Constitutional: Positive for fatigue.  Respiratory: Positive for shortness of breath (with exertion; none at rest). Negative for cough and chest tightness.   Cardiovascular: Negative for chest pain, palpitations and leg swelling.  Gastrointestinal: Negative.   Musculoskeletal: Gait problem: difficulty walking 2/2 increasing fatigue.  Neurological: Positive for weakness. Negative for dizziness and headaches.  Psychiatric/Behavioral: Negative.        Objective:   Physical Exam  Constitutional: She is oriented to person, place, and time. No distress.  Cardiovascular: Normal rate, regular rhythm and normal heart sounds.  Exam reveals no gallop.   No murmur heard. Pulmonary/Chest: Effort normal and breath sounds normal. No respiratory distress. She has no wheezes. She has  no rales.  Abdominal: Soft. Bowel sounds are normal.  Musculoskeletal: Normal range of motion.  Neurological: She is alert and oriented to person, place, and time.  Psychiatric: She has a normal mood and affect. Her behavior is normal. Judgment and thought content normal.      Assessment & Plan:   1. Fatigue Pt with hx of ongoing fatigue with c/o worsening in the last few days. Recent labs show no anemia. Check thyroid. Pt has started taking neurontin ~ 1 month ago for peripheral neuropathy. Common side effect of neurontin is fatigue. If labs are normal and echo does not reveal any findings indicative of her symptoms, then consider holding neurontin to see if symptoms improve. - TSH  2. Shortness of breath I am sending her to have an echo given hx of  receiving adriamycin for breast cancer. Note greater than 30 min were spent in face to face communication pertaining to the problems listed, including reviewing records from other providers, previous labs and in the assessment, planning and implementation of care pertaining to the problems. - 2D Echocardiogram without contrast; Future

## 2013-07-05 NOTE — Progress Notes (Signed)
Pre visit review using our clinic review tool, if applicable. No additional management support is needed unless otherwise documented below in the visit note. 

## 2013-07-06 ENCOUNTER — Encounter: Payer: Self-pay | Admitting: Adult Health

## 2013-07-06 LAB — TSH: TSH: 1.24 u[IU]/mL (ref 0.35–4.50)

## 2013-07-28 ENCOUNTER — Telehealth: Payer: Self-pay | Admitting: *Deleted

## 2013-07-28 ENCOUNTER — Ambulatory Visit (INDEPENDENT_AMBULATORY_CARE_PROVIDER_SITE_OTHER): Payer: BC Managed Care – PPO | Admitting: Internal Medicine

## 2013-07-28 ENCOUNTER — Other Ambulatory Visit: Payer: Self-pay | Admitting: *Deleted

## 2013-07-28 ENCOUNTER — Encounter: Payer: Self-pay | Admitting: Internal Medicine

## 2013-07-28 VITALS — BP 140/72 | HR 91 | Temp 98.4°F | Ht 64.0 in | Wt 163.5 lb

## 2013-07-28 DIAGNOSIS — M797 Fibromyalgia: Secondary | ICD-10-CM

## 2013-07-28 DIAGNOSIS — R5381 Other malaise: Secondary | ICD-10-CM

## 2013-07-28 DIAGNOSIS — R5383 Other fatigue: Secondary | ICD-10-CM

## 2013-07-28 DIAGNOSIS — IMO0001 Reserved for inherently not codable concepts without codable children: Secondary | ICD-10-CM

## 2013-07-28 DIAGNOSIS — R0600 Dyspnea, unspecified: Secondary | ICD-10-CM | POA: Insufficient documentation

## 2013-07-28 DIAGNOSIS — R0609 Other forms of dyspnea: Secondary | ICD-10-CM

## 2013-07-28 DIAGNOSIS — R0789 Other chest pain: Secondary | ICD-10-CM | POA: Insufficient documentation

## 2013-07-28 DIAGNOSIS — R0989 Other specified symptoms and signs involving the circulatory and respiratory systems: Secondary | ICD-10-CM

## 2013-07-28 LAB — LIPID PANEL
CHOL/HDL RATIO: 4
Cholesterol: 231 mg/dL — ABNORMAL HIGH (ref 0–200)
HDL: 55.3 mg/dL (ref >39.00)
LDL Cholesterol: 146 mg/dL — ABNORMAL HIGH (ref 0–99)
NONHDL: 175.7
Triglycerides: 151 mg/dL — ABNORMAL HIGH (ref 0.0–149.0)
VLDL: 30.2 mg/dL (ref 0.0–40.0)

## 2013-07-28 LAB — COMPREHENSIVE METABOLIC PANEL
ALBUMIN: 4.3 g/dL (ref 3.5–5.2)
ALT: 26 U/L (ref 0–35)
AST: 24 U/L (ref 0–37)
Alkaline Phosphatase: 66 U/L (ref 39–117)
BUN: 17 mg/dL (ref 6–23)
CALCIUM: 10.1 mg/dL (ref 8.4–10.5)
CHLORIDE: 103 meq/L (ref 96–112)
CO2: 27 meq/L (ref 19–32)
CREATININE: 0.7 mg/dL (ref 0.4–1.2)
GFR: 87.86 mL/min (ref >60.00)
Glucose, Bld: 83 mg/dL (ref 70–99)
Potassium: 4 mEq/L (ref 3.5–5.1)
Sodium: 138 mEq/L (ref 135–145)
Total Bilirubin: 0.5 mg/dL (ref 0.2–1.2)
Total Protein: 7.8 g/dL (ref 6.0–8.3)

## 2013-07-28 LAB — TROPONIN I

## 2013-07-28 MED ORDER — LIDOCAINE 5 % EX PTCH: 1.0000 | MEDICATED_PATCH | CUTANEOUS | Status: AC

## 2013-07-28 NOTE — Assessment & Plan Note (Signed)
Recent dyspnea with exertion concerning for CAD. EKG normal. Cardiology evaluation pending. Will check troponin and BNP with labs. Pulmonary exam normal today, however if cardiac workup normal, consider PFTs.

## 2013-07-28 NOTE — Progress Notes (Signed)
Subjective:    Patient ID: Andrea Golden, female    DOB: 1950/04/20, 63 y.o.   MRN: [id number]  HPI 63YO female presents for acute visit for fatigue.  Complains of severe fatigue since early May. Was seen by NP and scheduled for ECHO. Complains of shortness of breath at rest and on exertion, progressively getting worse.  Feels heaviness across chest at times. Worse with exertion. Symptoms last a few moments then resolve without intervention. Has diffuse pain in her legs at times, but this is typical of fibromyalgia. No swelling in legs. In the distant past, had stress test which was normal.  Last night, was sleeping, woke up, feeling "bad."  5:30am woke again feeling "heavy", burning in face, toes blue in color, legs felt heavy,short of breath. No fever, chills. Symptoms have resolved, but continues to feel fatigued.    Review of Systems  Constitutional: Positive for fatigue. Negative for fever, chills, appetite change and unexpected weight change.  HENT: Negative for congestion, ear pain, sinus pressure, sore throat, trouble swallowing and voice change.   Eyes: Negative for visual disturbance.  Respiratory: Positive for chest tightness and shortness of breath. Negative for cough, wheezing and stridor.   Cardiovascular: Positive for chest pain. Negative for palpitations and leg swelling.  Gastrointestinal: Negative for nausea, vomiting, abdominal pain, diarrhea, constipation, blood in stool, abdominal distention and anal bleeding.  Genitourinary: Negative for dysuria and flank pain.  Musculoskeletal: Positive for arthralgias, back pain and myalgias. Negative for gait problem and neck pain.  Skin: Negative for color change and rash.  Neurological: Negative for dizziness and headaches.  Hematological: Negative for adenopathy. Does not bruise/bleed easily.  Psychiatric/Behavioral: Positive for sleep disturbance. Negative for suicidal ideas and dysphoric mood. The patient is not  nervous/anxious.        Objective:    BP 140/72  Pulse 91  Temp(Src) 98.4 F (36.9 C) (Oral)  Ht 5\' 4"  (1.626 m)  Wt 163 lb 8 oz (74.163 kg)  BMI 28.05 kg/m2  SpO2 95% Physical Exam  Constitutional: She is oriented to person, place, and time. She appears well-developed and well-nourished. No distress.  HENT:  Head: Normocephalic and atraumatic.  Right Ear: External ear normal.  Left Ear: External ear normal.  Nose: Nose normal.  Mouth/Throat: Oropharynx is clear and moist. No oropharyngeal exudate.  Eyes: Conjunctivae are normal. Pupils are equal, round, and reactive to light. Right eye exhibits no discharge. Left eye exhibits no discharge. No scleral icterus.  Neck: Normal range of motion. Neck supple. No tracheal deviation present. No thyromegaly present.  Cardiovascular: Normal rate, regular rhythm, normal heart sounds and intact distal pulses.  Exam reveals no gallop and no friction rub.   No murmur heard. Pulmonary/Chest: Effort normal and breath sounds normal. No accessory muscle usage. Not tachypneic. No respiratory distress. She has no decreased breath sounds. She has no wheezes. She has no rhonchi. She has no rales. She exhibits no tenderness.  Musculoskeletal: Normal range of motion. She exhibits no edema and no tenderness.  Lymphadenopathy:    She has no cervical adenopathy.  Neurological: She is alert and oriented to person, place, and time. No cranial nerve deficit. She exhibits normal muscle tone. Coordination normal.  Skin: Skin is warm and dry. No rash noted. She is not diaphoretic. No erythema. No pallor.  Psychiatric: She has a normal mood and affect. Her behavior is normal. Judgment and thought content normal.          Assessment & Plan:  Problem List Items Addressed This Visit     Unprioritized   Chest heaviness - Primary     Chest heaviness, fatigue, and dyspnea concerning for CAD. EKG normal today. Will check troponin and BNP. Evaluation scheduled  with cardiology including ECHO. Pt at increased risk CAD given h/o XRT. If symptoms become persistent at any point, pt advised to got to ED for urgent evaluation.    Relevant Orders      Ambulatory referral to Cardiology      EKG 12-Lead (Completed)      B Nat Peptide      Troponin I      Comprehensive metabolic panel      Lipid Profile   Dyspnea     Recent dyspnea with exertion concerning for CAD. EKG normal. Cardiology evaluation pending. Will check troponin and BNP with labs. Pulmonary exam normal today, however if cardiac workup normal, consider PFTs.    Relevant Orders      Ambulatory referral to Cardiology   Fatigue     Recent worsening symptoms of fatigue concerning for CAD. EKG normal today. Recent labs including TSH and CBC normal. Will check BNP and troponin with labs. Cardiology evaluation pending.    Fibromyalgia       Return in about 4 weeks (around 08/25/2013) for Recheck.

## 2013-07-28 NOTE — Assessment & Plan Note (Signed)
Recent worsening symptoms of fatigue concerning for CAD. EKG normal today. Recent labs including TSH and CBC normal. Will check BNP and troponin with labs. Cardiology evaluation pending.

## 2013-07-28 NOTE — Progress Notes (Signed)
Pre visit review using our clinic review tool, if applicable. No additional management support is needed unless otherwise documented below in the visit note. 

## 2013-07-28 NOTE — Telephone Encounter (Signed)
Spoke with the pt and informed her that the wrong tube was draw for 1 of the blood test that the provider ordered for her today.  I apologized to the pt and asked if she could come back in on Monday and let us redraw the test.  Pt understood and agreed.  Pt was scheduled a lab appt for Mon (07-31-13 @ 2:00pm).//AB/CMA

## 2013-07-28 NOTE — Telephone Encounter (Signed)
[facility] called stating that Troponin I = less than 0.01, no indication of myocardial injury

## 2013-07-28 NOTE — Assessment & Plan Note (Addendum)
Chest heaviness, fatigue, and dyspnea concerning for CAD. EKG normal today. Will check troponin and BNP. Evaluation scheduled with cardiology including ECHO. Pt at increased risk CAD given h/o XRT. If symptoms become persistent at any point, pt advised to got to ED for urgent evaluation.

## 2013-07-31 ENCOUNTER — Other Ambulatory Visit (INDEPENDENT_AMBULATORY_CARE_PROVIDER_SITE_OTHER): Payer: BC Managed Care – PPO

## 2013-07-31 ENCOUNTER — Other Ambulatory Visit: Payer: BC Managed Care – PPO

## 2013-07-31 ENCOUNTER — Other Ambulatory Visit: Payer: Self-pay

## 2013-07-31 DIAGNOSIS — R079 Chest pain, unspecified: Secondary | ICD-10-CM

## 2013-07-31 DIAGNOSIS — R0602 Shortness of breath: Secondary | ICD-10-CM

## 2013-07-31 LAB — BRAIN NATRIURETIC PEPTIDE: PRO B NATRI PEPTIDE: 29 pg/mL (ref 0.0–100.0)

## 2013-08-02 ENCOUNTER — Telehealth: Payer: Self-pay

## 2013-08-02 ENCOUNTER — Telehealth: Payer: Self-pay | Admitting: Internal Medicine

## 2013-08-02 NOTE — Telephone Encounter (Signed)
Her echo showed normal wall motion and systolic function of the heart. Valves were normal.

## 2013-08-02 NOTE — Telephone Encounter (Signed)
Pt would like echo results 

## 2013-08-02 NOTE — Telephone Encounter (Signed)
The patient is wanting her result from Echo that was done at Dr. Rockey Situ.

## 2013-08-02 NOTE — Telephone Encounter (Signed)
Instructed patient to contact her PCP for results  Patient verbalized understanding

## 2013-08-03 NOTE — Telephone Encounter (Signed)
Notified pt. 

## 2013-08-03 NOTE — Telephone Encounter (Signed)
Yes, she should keep appointment. The blood work is not conclusive testing and she has higher risk of heart disease after her previous cancer treatment.

## 2013-08-03 NOTE — Telephone Encounter (Signed)
Pt is asking if she should keep her appt with Dr Rockey Situ on June 25th if echo and blood work is coming back normal

## 2013-08-17 ENCOUNTER — Telehealth: Payer: Self-pay

## 2013-08-17 ENCOUNTER — Encounter: Payer: Self-pay | Admitting: Cardiovascular Disease

## 2013-08-17 ENCOUNTER — Other Ambulatory Visit: Payer: Self-pay | Admitting: Cardiovascular Disease

## 2013-08-17 ENCOUNTER — Ambulatory Visit (INDEPENDENT_AMBULATORY_CARE_PROVIDER_SITE_OTHER): Payer: BC Managed Care – PPO | Admitting: Cardiovascular Disease

## 2013-08-17 VITALS — BP 160/80 | HR 95 | Ht 64.0 in | Wt 163.5 lb

## 2013-08-17 DIAGNOSIS — R5383 Other fatigue: Secondary | ICD-10-CM

## 2013-08-17 DIAGNOSIS — R0602 Shortness of breath: Secondary | ICD-10-CM

## 2013-08-17 DIAGNOSIS — E785 Hyperlipidemia, unspecified: Secondary | ICD-10-CM | POA: Insufficient documentation

## 2013-08-17 DIAGNOSIS — R0600 Dyspnea, unspecified: Secondary | ICD-10-CM

## 2013-08-17 DIAGNOSIS — R0609 Other forms of dyspnea: Secondary | ICD-10-CM

## 2013-08-17 DIAGNOSIS — R079 Chest pain, unspecified: Secondary | ICD-10-CM

## 2013-08-17 DIAGNOSIS — R0989 Other specified symptoms and signs involving the circulatory and respiratory systems: Secondary | ICD-10-CM

## 2013-08-17 DIAGNOSIS — R0789 Other chest pain: Secondary | ICD-10-CM

## 2013-08-17 DIAGNOSIS — Z853 Personal history of malignant neoplasm of breast: Secondary | ICD-10-CM

## 2013-08-17 DIAGNOSIS — R5381 Other malaise: Secondary | ICD-10-CM

## 2013-08-17 LAB — D-DIMER(ARMC): D-Dimer: 449 ng/ml

## 2013-08-17 MED ORDER — ATORVASTATIN CALCIUM 10 MG PO TABS: 10.0000 mg | ORAL_TABLET | Freq: Every day | ORAL | Status: DC

## 2013-08-17 NOTE — Patient Instructions (Signed)
Cause of you shortness of breath is unclear at this time We will order a D-dimer to rule out pulmonary embolism We will call with the results  If the lab test is negative, we should consider a stress test  Please start atorvastatin one a day for cholesterol  Please call us if you have new issues that need to be addressed before your next appt.

## 2013-08-17 NOTE — Telephone Encounter (Signed)
Spoke w/ pt.  She would like to proceed w/ scheduling lexiscan, as she cannot walk on the treadmill.  Lexi sched for 08/21/13 @ 8:00, [facility]. Reviewed instructions w/ pt.  She verbalizes understanding and will call w/ further questions or concerns.

## 2013-08-17 NOTE — Telephone Encounter (Signed)
Left message for pt that D-dimer is WNL, to please call if she would like to sched stress myoview.

## 2013-08-17 NOTE — Assessment & Plan Note (Signed)
Of concern about prior history of breast cancer, radiation to the left. This can accelerate underlying atherosclerosis. We'll need to treat her cholesterol aggressively

## 2013-08-17 NOTE — Assessment & Plan Note (Signed)
Etiology of her fatigue is unclear at this time. Differential includes PE/DVT as above, ischemia, flareup of her fibromyalgia

## 2013-08-17 NOTE — Progress Notes (Signed)
Patient ID: Andrea Golden, female    DOB: 1950/11/21, 63 y.o.   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  She reports that she is doing well in early may 2015. She went to the beach, low grade, was very active with no complaints. She came home and roughly around that time had acute onset of heaviness in her chest, described as a pain. Difficult he sleeping, also pain in her left upper leg for several days. Since that time she has not recovered well as she is continued to have shortness of breath with exertion, fatigue particularly noticeable with stairs. Prior EKGs were essentially normal She denies any significant cough, orthopnea or PND No significant leg swelling. She reports chest discomfort is not her normal symptom when she has fibromyalgia. This is different.  Recent echocardiogram was essentially normal with ejection fraction 55-60%.  Prior CT scans of the abdomen and pelvis showing aortic atherosclerosis Lab work showed total cholesterol 231  EKG shows normal sinus rhythm with rate 95 beats per minute, nonspecific ST abnormality in the anterolateral leads    Outpatient Encounter Prescriptions as of 08/17/2013  Medication Sig  . amoxicillin (AMOXIL) 500 MG capsule Take 500 mg by mouth as needed.   . Ascorbic Acid (VITAMIN C) 100 MG tablet Take 100 mg by mouth daily.  Marland Kitchen aspirin 81 MG tablet Take 81 mg by mouth daily.  . B Complex-C (B-COMPLEX WITH VITAMIN C) tablet Take 1 tablet by mouth daily.  . Biotin 5000 MCG TABS Take 1 tablet by mouth daily.  . cetirizine (ZYRTEC) 10 MG tablet Take 10 mg by mouth daily.  Marland Kitchen lidocaine (LIDODERM) 5 % Place 1 patch onto the skin daily. Remove & Discard patch within 12 hours or as  directed by MD as needed  . metroNIDAZOLE (METROGEL) 1 % gel Apply topically daily.  . Probiotic Product (ALIGN PO) Take by mouth.  . Propylene Glycol (SYSTANE BALANCE OP) Apply to eye.  . ranitidine (ZANTAC) 150 MG capsule Take 150 mg by mouth daily.  . sodium chloride (OCEAN) 0.65 % SOLN nasal spray Place 1 spray into both nostrils as needed for congestion.  Marland Kitchen atorvastatin (LIPITOR) 10 MG tablet Take 1 tablet (10 mg total) by mouth daily.     Review of Systems  Constitutional: Positive for fatigue.  HENT: Negative.   Eyes: Negative.   Respiratory: Positive for chest tightness and shortness of breath.   Cardiovascular: Positive for chest pain.  Gastrointestinal: Negative.   Endocrine: Negative.   Musculoskeletal: Negative.   Skin: Negative.   Allergic/Immunologic: Negative.   Neurological: Negative.   Hematological: Negative.   Psychiatric/Behavioral: Negative.   All other systems reviewed and are negative.   BP 160/80  Pulse 95  Ht 5\' 4"  (1.626 m)  Wt 163 lb 8 oz (74.163 kg)  BMI 28.05 kg/m2  Physical Exam  Nursing note and vitals reviewed. Constitutional: She is oriented to person, place, and time. She appears well-developed and well-nourished.  HENT:  Head: Normocephalic.  Nose: Nose normal.  Mouth/Throat: Oropharynx is clear and moist.  Eyes: Conjunctivae are normal. Pupils are equal, round, and reactive to light.  Neck: Normal range of motion. Neck supple. No JVD present.  Cardiovascular: Normal rate, regular rhythm, S1 normal, S2 normal,  normal heart sounds and intact distal pulses.  Exam reveals no gallop and no friction rub.   No murmur heard. Pulmonary/Chest: Effort normal and breath sounds normal. No respiratory distress. She has no wheezes. She has no rales. She exhibits no tenderness.  Abdominal: Soft. Bowel sounds are normal. She exhibits no distension. There is no tenderness.  Musculoskeletal: Normal range of motion. She exhibits no edema and no  tenderness.  Lymphadenopathy:    She has no cervical adenopathy.  Neurological: She is alert and oriented to person, place, and time. Coordination normal.  Skin: Skin is warm and dry. No rash noted. No erythema.  Psychiatric: She has a normal mood and affect. Her behavior is normal. Judgment and thought content normal.    Assessment and Plan

## 2013-08-17 NOTE — Assessment & Plan Note (Signed)
We spent a long time discussing possible options for workup for her shortness of breath, fatigue, chest tightness. D-dimer pending, we'll consider stress testing if that is negative

## 2013-08-17 NOTE — Assessment & Plan Note (Signed)
Etiology of her shortness of breath is unclear. I'm concerned about her recent trip to the beach at the beginning of may 2015, with subsequent severe leg discomfort lasting 3 days, around the same time having acute onset of shortness of breath and chest discomfort now with chronic fatigue and shortness of breath.  Have ordered a d-dimer to help rule out DVT, PE. If this is borderline or elevated, would probably recommend CTA of the chest to rule out PE. We discussed with her possible need for stress testing. She does have several risk factors for CAD including prior radiation treatment, long history of smoking, hyperlipidemia. She has atherosclerosis on prior CT scans. We will call her with the results of her d-dimer.

## 2013-08-17 NOTE — Assessment & Plan Note (Signed)
Total cholesterol 230. High risk of CAD given prior radiation, high cholesterol, prior smoking history. She has atherosclerosis on prior CT scan. We have suggested she start Lipitor 10 mg daily

## 2013-08-20 ENCOUNTER — Encounter: Payer: Self-pay | Admitting: Adult Health

## 2013-08-21 ENCOUNTER — Ambulatory Visit: Payer: Self-pay | Admitting: Cardiovascular Disease

## 2013-08-21 DIAGNOSIS — R079 Chest pain, unspecified: Secondary | ICD-10-CM

## 2013-08-23 ENCOUNTER — Telehealth: Payer: Self-pay

## 2013-08-23 NOTE — Telephone Encounter (Signed)
Pt would like stress test results. Please call. 

## 2013-08-23 NOTE — Telephone Encounter (Signed)
Spoke w/ pt.  Advised pt that there was some difficulty getting the images where they need to be, but this has been resolved and to expect a call from [place] tomorrow.

## 2013-08-24 NOTE — Telephone Encounter (Signed)
Dr. Fletcher Anon initially the images and felt that they were okay Since then the hospital has had difficulty processing the report and images We have been calling them daily to fix it, they are now talking with their computer people in an effort to fix it so I can read the report

## 2013-08-28 NOTE — Telephone Encounter (Signed)
Pt calling requesting her official results.   Can you read it yet?

## 2013-08-29 ENCOUNTER — Ambulatory Visit: Payer: BC Managed Care – PPO | Admitting: Internal Medicine

## 2013-08-29 NOTE — Telephone Encounter (Signed)
Per Dr. Rockey Situ, he and Dr. Fletcher Anon were able to pull up pictures and to let pt know that results are normal.  Reviewed results w/ pt.   She will call w/ further questions or concerns.

## 2013-10-04 ENCOUNTER — Ambulatory Visit (INDEPENDENT_AMBULATORY_CARE_PROVIDER_SITE_OTHER): Payer: Medicare Other | Admitting: Internal Medicine

## 2013-10-04 ENCOUNTER — Encounter: Payer: Self-pay | Admitting: Internal Medicine

## 2013-10-04 VITALS — BP 140/76 | HR 84 | Temp 97.9°F | Ht 64.0 in | Wt 162.5 lb

## 2013-10-04 DIAGNOSIS — IMO0001 Reserved for inherently not codable concepts without codable children: Secondary | ICD-10-CM

## 2013-10-04 DIAGNOSIS — Z789 Other specified health status: Secondary | ICD-10-CM

## 2013-10-04 DIAGNOSIS — M797 Fibromyalgia: Secondary | ICD-10-CM

## 2013-10-04 DIAGNOSIS — E785 Hyperlipidemia, unspecified: Secondary | ICD-10-CM

## 2013-10-04 DIAGNOSIS — R0789 Other chest pain: Secondary | ICD-10-CM

## 2013-10-04 DIAGNOSIS — Z888 Allergy status to other drugs, medicaments and biological substances status: Secondary | ICD-10-CM

## 2013-10-04 NOTE — Patient Instructions (Signed)
Follow up for Wellness Visit in 4 weeks.

## 2013-10-04 NOTE — Assessment & Plan Note (Signed)
Some persistent symptoms of chest heaviness. Recent cardiac evaluation with stress test was normal. Recommended that we set up pulmonary evaluation with PFTs given her h/o XRT but she would like to hold off for now. She will call if she would like to consider this in the future.

## 2013-10-04 NOTE — Assessment & Plan Note (Signed)
Persistent symptoms of diffuse joint and muscle pain. Will continue prn Ibuprofen and Lidoderm for now, as she is reluctant to try additional medications.

## 2013-10-04 NOTE — Progress Notes (Signed)
Subjective:    Patient ID: Andrea Golden, female    DOB: [birth year], 63 y.o.   MRN: [id number]  HPI 63YO female presents for follow up.  Recently evaluated for chest heaviness. Stress test was normal. Continues to have chest heaviness. No dyspnea. No palpitations. Has some cough on occasion. When wakes from sleep feels "awful" from joint pain. Stopped taking Atorvastatin because of joint pain. Having some left hip pain last few weeks. Better now. Takes occasional ibuprofen with some improvement. Lidoderm also helps.Rain makes symptoms worse.   Review of Systems  Constitutional: Positive for fatigue. Negative for fever, chills, appetite change and unexpected weight change.  Eyes: Negative for visual disturbance.  Respiratory: Positive for chest tightness. Negative for shortness of breath and wheezing.   Cardiovascular: Negative for chest pain, palpitations and leg swelling.  Gastrointestinal: Negative for abdominal pain.  Musculoskeletal: Positive for arthralgias, back pain and myalgias. Negative for joint swelling.  Skin: Negative for color change and rash.  Hematological: Negative for adenopathy. Does not bruise/bleed easily.  Psychiatric/Behavioral: Positive for sleep disturbance. Negative for dysphoric mood. The patient is not nervous/anxious.        Objective:    BP 140/76  Pulse 84  Temp(Src) 97.9 F (36.6 C) (Oral)  Ht 5\' 4"  (1.626 m)  Wt 162 lb 8 oz (73.71 kg)  BMI 27.88 kg/m2  SpO2 96% Physical Exam  Constitutional: She is oriented to person, place, and time. She appears well-developed and well-nourished. No distress.  HENT:  Head: Normocephalic and atraumatic.  Right Ear: External ear normal.  Left Ear: External ear normal.  Nose: Nose normal.  Mouth/Throat: Oropharynx is clear and moist. No oropharyngeal exudate.  Eyes: Conjunctivae are normal. Pupils are equal, round, and reactive to light. Right eye exhibits no discharge. Left eye exhibits no discharge.  No scleral icterus.  Neck: Normal range of motion. Neck supple. No tracheal deviation present. No thyromegaly present.  Cardiovascular: Normal rate, regular rhythm, normal heart sounds and intact distal pulses.  Exam reveals no gallop and no friction rub.   No murmur heard. Pulmonary/Chest: Effort normal and breath sounds normal. No accessory muscle usage. Not tachypneic. No respiratory distress. She has no decreased breath sounds. She has no wheezes. She has no rhonchi. She has no rales. She exhibits no tenderness.  Musculoskeletal: Normal range of motion. She exhibits no edema and no tenderness.  Lymphadenopathy:    She has no cervical adenopathy.  Neurological: She is alert and oriented to person, place, and time. No cranial nerve deficit. She exhibits normal muscle tone. Coordination normal.  Skin: Skin is warm and dry. No rash noted. She is not diaphoretic. No erythema. No pallor.  Psychiatric: She has a normal mood and affect. Her behavior is normal. Judgment and thought content normal.          Assessment & Plan:   Problem List Items Addressed This Visit     Unprioritized   Chest heaviness - Primary     Some persistent symptoms of chest heaviness. Recent cardiac evaluation with stress test was normal. Recommended that we set up pulmonary evaluation with PFTs given her h/o XRT but she would like to hold off for now. She will call if she would like to consider this in the future.    Fibromyalgia     Persistent symptoms of diffuse joint and muscle pain. Will continue prn Ibuprofen and Lidoderm for now, as she is reluctant to try additional medications.    Hyperlipidemia  She has been unable to tolerate statin medications. Recommended Mediterranean style diet and regular exercise such as walking.    Statin intolerance       Return in about 4 weeks (around 11/01/2013) for Wellness Visit.

## 2013-10-04 NOTE — Assessment & Plan Note (Signed)
She has been unable to tolerate statin medications. Recommended Mediterranean style diet and regular exercise such as walking.

## 2013-10-04 NOTE — Progress Notes (Signed)
Pre visit review using our clinic review tool, if applicable. No additional management support is needed unless otherwise documented below in the visit note. 

## 2013-11-03 ENCOUNTER — Ambulatory Visit: Payer: Medicare Other | Admitting: Internal Medicine

## 2013-11-21 ENCOUNTER — Encounter: Payer: Self-pay | Admitting: Internal Medicine

## 2013-11-21 ENCOUNTER — Ambulatory Visit (INDEPENDENT_AMBULATORY_CARE_PROVIDER_SITE_OTHER): Payer: Medicare Other | Admitting: Internal Medicine

## 2013-11-21 VITALS — BP 140/74 | HR 81 | Temp 98.0°F | Ht 64.5 in | Wt 163.2 lb

## 2013-11-21 DIAGNOSIS — Z23 Encounter for immunization: Secondary | ICD-10-CM | POA: Diagnosis not present

## 2013-11-21 DIAGNOSIS — Z0000 Encounter for general adult medical examination without abnormal findings: Secondary | ICD-10-CM | POA: Insufficient documentation

## 2013-11-21 LAB — HM PAP SMEAR

## 2013-11-21 LAB — HM MAMMOGRAPHY

## 2013-11-21 NOTE — Progress Notes (Signed)
The patient is here for annual Medicare Wellness Examination and management of other chronic and acute problems.   The risk factors are reflected in the history.  The roster of all physicians providing medical care to patient - is listed in the Snapshot section of the chart.  Activities of daily living:   The patient is 100% independent in all ADLs: dressing, toileting, feeding as well as independent mobility. Patient lives with husband in a two-story home. Has hardwood and carpeted floors. No pets.  Home safety :  The patient has smoke detectors in the home.  They wear seatbelts in their car. There are no firearms at home.  There is no violence in the home. They feel safe where they live.  Infectious Risks: There is no risks for hepatitis, STDs or HIV.  There is no  history of blood transfusion.  They have no travel history to infectious disease endemic areas of the world.  Additional Health Care Providers: The patient has seen their dentist in the last six months. Dentist - Dr. Hilliard Clark in [place] and Dr. Golden Circle in [place] They have seen their eye doctor in the last year. Opthalmologist - [facility] They deny hearing issues. They have deferred audiologic testing in the last year.   They do not  have excessive sun exposure. Discussed the need for sun protection: hats,long sleeves and use of sunscreen if there is significant sun exposure.  Dermatologist - Dr. Nicole Kindred Oncologist - Dr. Tressa Busman Cardiology - Dr. Rockey Situ  Diet: the importance of a healthy diet is discussed. They do have a healthy diet.   The benefits of regular aerobic exercise were discussed. Patient exercises by using elliptical, except when pain is severe.  Depression screen: there are no signs or vegative symptoms of depression- irritability, change in appetite, anhedonia, sadness/tearfullness.  Cognitive assessment: the patient manages all their financial and personal affairs and is actively engaged. They  could relate day,date,year and events.  HCPOA - none in place Living Will - none in place  The following portions of the patient's history were reviewed and updated as appropriate: allergies, current medications, past family history, past medical history,  past surgical history, past social history and problem list.  Visual acuity was not assessed per patient preference as they have regular follow up with their ophthalmologist. Hearing and body mass index were assessed and reviewed.   During the course of the visit the patient was educated and counseled about appropriate screening and preventive services including : fall prevention , diabetes screening, nutrition counseling, colorectal cancer screening, and recommended immunizations.    Review of Systems  Constitutional: Negative for fever, chills, appetite change, fatigue and unexpected weight change.  Eyes: Negative for visual disturbance.  Respiratory: Negative for shortness of breath.   Cardiovascular: Negative for chest pain and leg swelling.  Gastrointestinal: Negative for nausea, vomiting, abdominal pain, diarrhea and constipation.  Musculoskeletal: Positive for arthralgias and myalgias.  Skin: Negative for color change and rash.  Neurological: Negative for weakness.  Hematological: Negative for adenopathy. Does not bruise/bleed easily.  Psychiatric/Behavioral: Negative for suicidal ideas, confusion, sleep disturbance, dysphoric mood and decreased concentration. The patient is not nervous/anxious.        Objective:    BP 140/74  Pulse 81  Temp(Src) 98 F (36.7 C) (Oral)  Ht 5' 4.5" (1.638 m)  Wt 163 lb 4 oz (74.05 kg)  BMI 27.60 kg/m2  SpO2 93% Physical Exam  Constitutional: She is oriented to person, place, and time. She appears well-developed  and well-nourished. No distress.  HENT:  Head: Normocephalic and atraumatic.  Right Ear: External ear normal.  Left Ear: External ear normal.  Nose: Nose normal.  Mouth/Throat:  Oropharynx is clear and moist. No oropharyngeal exudate.  Eyes: Conjunctivae and EOM are normal. Pupils are equal, round, and reactive to light. Right eye exhibits no discharge.  Neck: Normal range of motion. Neck supple. No thyromegaly present.  Cardiovascular: Normal rate, regular rhythm, normal heart sounds and intact distal pulses.  Exam reveals no gallop and no friction rub.   No murmur heard. Pulmonary/Chest: Effort normal. No respiratory distress. She has no wheezes. She has no rales.  Abdominal: Soft. Bowel sounds are normal. She exhibits no distension and no mass. There is no tenderness. There is no rebound and no guarding.  Musculoskeletal: Normal range of motion. She exhibits no edema and no tenderness.  Lymphadenopathy:    She has no cervical adenopathy.  Neurological: She is alert and oriented to person, place, and time. No cranial nerve deficit. Coordination normal.  Skin: Skin is warm and dry. No rash noted. She is not diaphoretic. No erythema. No pallor.  Psychiatric: She has a normal mood and affect. Her behavior is normal. Judgment and thought content normal.          Assessment & Plan:   Problem List Items Addressed This Visit     Unprioritized   Medicare annual wellness visit, initial - Primary     General medical exam normal today. Pt declines breast exam as her oncologist will perform this later this week. Pelvic exam deferred as pt s/p hysterectomy. Mammogram scheduled for tomorrow. Colonoscopy UTD. Flu vaccine and Prevnar vaccine given today. Labs from 05/2013 and 07/2013 reviewed with pt, will hold off on additional labs today. Encouraged healthy diet and exercise as tolerated. Encouraged her and her husband to set up HCPOA and living will. Follow up 1 year and prn.     Other Visit Diagnoses   Need for prophylactic vaccination against Streptococcus pneumoniae (pneumococcus)        Relevant Orders       Pneumococcal conjugate vaccine 13-valent (Completed)    Need  for prophylactic vaccination and inoculation against influenza            Return in about 1 year (around 11/22/2014) for Wellness Visit.

## 2013-11-21 NOTE — Patient Instructions (Signed)

## 2013-11-21 NOTE — Assessment & Plan Note (Signed)
General medical exam normal today. Pt declines breast exam as her oncologist will perform this later this week. Pelvic exam deferred as pt s/p hysterectomy. Mammogram scheduled for tomorrow. Colonoscopy UTD. Flu vaccine and Prevnar vaccine given today. Labs from 05/2013 and 07/2013 reviewed with pt, will hold off on additional labs today. Encouraged healthy diet and exercise as tolerated. Encouraged her and her husband to set up HCPOA and living will. Follow up 1 year and prn.

## 2013-11-21 NOTE — Progress Notes (Signed)
Pre visit review using our clinic review tool, if applicable. No additional management support is needed unless otherwise documented below in the visit note. 

## 2013-11-22 ENCOUNTER — Other Ambulatory Visit: Payer: Self-pay | Admitting: Oncology

## 2013-11-22 ENCOUNTER — Ambulatory Visit
Admission: RE | Admit: 2013-11-22 | Discharge: 2013-11-22 | Disposition: A | Discharge status: Home / Self Care | Payer: Medicare Other | Source: Ambulatory Visit | Attending: Oncology | Admitting: Oncology

## 2013-11-22 DIAGNOSIS — Z853 Personal history of malignant neoplasm of breast: Secondary | ICD-10-CM

## 2013-12-04 ENCOUNTER — Other Ambulatory Visit (HOSPITAL_BASED_OUTPATIENT_CLINIC_OR_DEPARTMENT_OTHER): Payer: Medicare Other

## 2013-12-04 ENCOUNTER — Ambulatory Visit (HOSPITAL_BASED_OUTPATIENT_CLINIC_OR_DEPARTMENT_OTHER): Payer: Medicare Other | Admitting: Oncology

## 2013-12-04 VITALS — BP 133/53 | HR 81 | Temp 98.1°F | Resp 20 | Ht 64.5 in | Wt 165.5 lb

## 2013-12-04 DIAGNOSIS — C50912 Malignant neoplasm of unspecified site of left female breast: Secondary | ICD-10-CM

## 2013-12-04 DIAGNOSIS — Z853 Personal history of malignant neoplasm of breast: Secondary | ICD-10-CM

## 2013-12-04 DIAGNOSIS — C50919 Malignant neoplasm of unspecified site of unspecified female breast: Secondary | ICD-10-CM

## 2013-12-04 LAB — COMPREHENSIVE METABOLIC PANEL (CC13)
ALT: 23 U/L (ref 0–55)
ANION GAP: 11 meq/L (ref 3–11)
AST: 21 U/L (ref 5–34)
Albumin: 3.9 g/dL (ref 3.5–5.0)
Alkaline Phosphatase: 68 U/L (ref 40–150)
BUN: 12 mg/dL (ref 7.0–26.0)
CALCIUM: 10 mg/dL (ref 8.4–10.4)
CO2: 23 meq/L (ref 22–29)
CREATININE: 0.8 mg/dL (ref 0.6–1.1)
Chloride: 107 mEq/L (ref 98–109)
Glucose: 125 mg/dl (ref 70–140)
Potassium: 3.8 mEq/L (ref 3.5–5.1)
Sodium: 140 mEq/L (ref 136–145)
Total Bilirubin: 0.2 mg/dL (ref 0.20–1.20)
Total Protein: 7.6 g/dL (ref 6.4–8.3)

## 2013-12-04 LAB — CBC & DIFF AND RETIC
BASO%: 0.5 % (ref 0.0–2.0)
Basophils Absolute: 0 10*3/uL (ref 0.0–0.1)
EOS%: 7.9 % — ABNORMAL HIGH (ref 0.0–7.0)
Eosinophils Absolute: 0.6 10*3/uL — ABNORMAL HIGH (ref 0.0–0.5)
HEMATOCRIT: 38.7 % (ref 34.8–46.6)
HGB: 13.2 g/dL (ref 11.6–15.9)
Immature Retic Fract: 9.7 % (ref 1.60–10.00)
LYMPH%: 25.9 % (ref 14.0–49.7)
MCH: 32.8 pg (ref 25.1–34.0)
MCHC: 34.1 g/dL (ref 31.5–36.0)
MCV: 96.3 fL (ref 79.5–101.0)
MONO#: 0.5 10*3/uL (ref 0.1–0.9)
MONO%: 6.1 % (ref 0.0–14.0)
NEUT#: 4.4 10*3/uL (ref 1.5–6.5)
NEUT%: 59.6 % (ref 38.4–76.8)
PLATELETS: 251 10*3/uL (ref 145–400)
RBC: 4.02 10*6/uL (ref 3.70–5.45)
RDW: 12.6 % (ref 11.2–14.5)
Retic %: 1.28 % (ref 0.70–2.10)
Retic Ct Abs: 51.46 10*3/uL (ref 33.70–90.70)
WBC: 7.4 10*3/uL (ref 3.9–10.3)
lymph#: 1.9 10*3/uL (ref 0.9–3.3)
nRBC: 0 % (ref 0–0)

## 2013-12-04 NOTE — Progress Notes (Signed)
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 She had subsequent breast radiation. (ADDENDUM: the patient's records from [facility] were received after her initial visit and that information was incorporated in the "Assessment" section below)  On 10/16/2011 the patient had routine diagnostic mammography at [facility], showing in addition to post surgical and post radiation changes in the left breast, a possible new density in the retroareolar portion of the left breast. Additional mammographic views on August 27 found no definite abnormality, but ultrasound the same day confirmed an echodense left-sided retroareolar density. The patient was then referred to the breast Center for further elevation, and biopsy of the area in question was obtained 11/19/2011. This showed ([id number]) benign breast parenchyma with dense fibrosis. There was some fat necrosis present. There was no atypia or malignancy identified.   Her subsequent history is as detailed below.  INTERVAL HISTORY: Andrea Golden returns today  for followup of her left breast cancer accompanied by her husband Andrea Golden. The interval history is generally unremarkable and a recently came from her beach trip where she was able to do a lot of walking. At home she uses the elliptical.  ROS: Due to complaints of pain in her hips elbows and knees. It's worse migraines. This is achy, and has been present a long time. It is not more persistent or intense than before. She sleeps poorly, partly because of the neuropathy she has in her feet. She feels  very tired all the time. She has palpitations but she tells me are not new, and also heartburn which he "just let's burn off by itself". She denies unusual headaches, visual changes, nausea, or vomiting. A detailed review of systems today was otherwise stable  PAST MEDICAL HISTORY: Past Medical History  Diagnosis Date  . Fibromyalgia   . 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 Arthritis   . Hypertension   . Depression   . Cancer 2011    left breast, s/p XRT and chemo, Dr. Lucretia Golden    PAST SURGICAL HISTORY: Past Surgical History  Procedure Laterality Date  . Abdominal hysterectomy  1985    one ovary remains  . Appendectomy    . Tonsillectomy    . Wisdom tooth extraction    . Mohs surgery      [place], nose  . Breast surgery      partial mastectomy, Dr. Jamal Golden  . Esophagogastroduodenoscopy  12/31/2011    Procedure: ESOPHAGOGASTRODUODENOSCOPY (EGD);  Surgeon: Andrea Castle, MD;  Location: [facility];  Service: Endoscopy;  Laterality: N/A;  . Tympanoplasty      slit    FAMILY HISTORY Family History  Problem Relation Age of Onset  . Cancer Mother     melanoma, uterine  . Kidney disease Mother   . Cancer Father     lung  . ALS Sister   . Cancer Brother     colon  . Cancer Brother     leukemia/brain tumor   the patient's father died from lung cancer at the age of 42. This was less than a year after his diagnosis. The patient's mother died  at the age of 32 from renal failure. The patient had 4 brothers and 2 sisters. One brother was diagnosed with colon cancer at the age of 48, and survives. One brother died 2011-09-08 at the age of 85 from "brain cancer". He also had "blood cancer". The patient has one sister who died from Andrea Golden. There is no history of breast or ovarian cancer in the family.  GYNECOLOGIC HISTORY: Menarche age 33, she is GX P2, first live birth age 9, hysterectomy with USO in 55. She used hormone replacement for about 6 years, with no side effects that she is aware of  and particularly no clotting issues.  SOCIAL HISTORY: (updated October 2013) She used to work in [employer], but is now retired. Her husband of 17 years, Andrea Golden, used to work in the [employer] in [place], but is now retired. Daughter Andrea Golden lives in [place] and is a homemaker. Son Andrea Golden lives in [place] and is disabled secondary to bipolar disease. The patient has 4 grandsons. She attends a [employer].  ADVANCED DIRECTIVES: Not in place  HEALTH MAINTENANCE: History  Substance Use Topics  . Smoking status: Former Smoker -- 1.00 packs/day for 20 years    Types: Cigarettes    Quit date: 12/10/2006  . Smokeless tobacco: Never Used  . Alcohol Use: No     Colonoscopy: 2009  Upper Endoscopy: Nov 2013  PAP: 2009  Bone density: 2009/ "normal"  Lipid panel:   Allergies  Allergen Reactions  . Benadryl [Diphenhydramine Hcl]     Stops kidneys  . Lyrica [Pregabalin] Shortness Of Breath  . Contrast Media [Iodinated Diagnostic Agents]     ALLERGIC TO GADOLINIUM  . Gluten   . Lidocaine     Lidocaine does NOT numb patient.  Used Nesacaine 1% (prolocaine)  . Novocain [Procaine Hcl] Other (See Comments)    Does not work  . Sulfa Drugs Cross Reactors     Doesn't remember  . Codeine Rash  . Topamax [Topiramate] Hives and Rash    Current Outpatient Prescriptions  Medication Sig Dispense Refill  . amoxicillin (AMOXIL) 500 MG capsule Take 500 mg by mouth as needed.       . Ascorbic Acid (VITAMIN C) 100 MG tablet Take 100 mg by mouth daily.      Marland Kitchen aspirin 81 MG tablet Take 81 mg by mouth daily.      . B Complex-C (B-COMPLEX WITH VITAMIN C) tablet Take 1 tablet by mouth daily.      . Biotin 5000 MCG TABS Take 1 tablet by mouth daily.      . cetirizine (ZYRTEC) 10 MG tablet Take 10 mg by mouth daily.      Marland Kitchen lidocaine (LIDODERM) 5 % Place 1 patch onto the skin daily. Remove & Discard patch within 12 hours or as directed by MD as needed  30 patch  6  .  metroNIDAZOLE (METROGEL) 1 % gel Apply topically daily.      . Probiotic Product (ALIGN PO) Take by mouth.      . Propylene Glycol (SYSTANE BALANCE OP) Apply to eye.      . ranitidine (ZANTAC) 150 MG capsule Take 150 mg by mouth daily.      . sodium chloride (OCEAN) 0.65 % SOLN nasal spray Place 1 spray into both nostrils as needed for congestion.      . [DISCONTINUED] propranolol (INDERAL LA) 60 MG 24 hr capsule Take 1 capsule (60 mg total) by mouth daily.  30 capsule  3   No current facility-administered medications for this visit.    OBJECTIVE: Middle-aged white woman in no acute distress Filed Vitals:   12/04/13 1446  BP: 133/53  Pulse: 81  Temp: 98.1 F (36.7 C)  Resp: 20     Body mass index is 27.98 kg/(m^2).    ECOG FS: 1  Filed Weights   12/04/13 1446  Weight: 165 lb 8 oz (75.07 kg)   Sclerae unicteric, pupils round and equal Oropharynx clear and moistir No cervical or supraclavicular adenopathy Lungs no rales or rhonchi Heart regular rate and rhythm Abd soft, nontender, positive bowel sounds MSK no focal spinal tenderness to mild/moderate percussion, no upper extremity lymphedema Neuro: nonfocal, well oriented, appropriate affect Breasts: The right breast is unremarkable. The left breast status post lumpectomy. There is no evidence of chest wall recurrence. The nipple areolar complex is absent. The left axilla is benign  LAB RESULTS: Lab Results  Component Value Date   WBC 7.4 12/04/2013   NEUTROABS 4.4 12/04/2013   HGB 13.2 12/04/2013   HCT 38.7 12/04/2013   MCV 96.3 12/04/2013   PLT 251 12/04/2013      Chemistry      Component Value Date/Time   NA 140 12/04/2013 1429   NA 138 07/28/2013 1059   NA 139 10/16/2011 1622   K 3.8 12/04/2013 1429   K 4.0 07/28/2013 1059   CL 103 07/28/2013 1059   CL 103 03/21/2012 1130   CO2 23 12/04/2013 1429   CO2 27 07/28/2013 1059   BUN 12.0 12/04/2013 1429   BUN 17 07/28/2013 1059   BUN 15 10/16/2011 1622   CREATININE 0.8  12/04/2013 1429   CREATININE 0.7 07/28/2013 1059      Component Value Date/Time   CALCIUM 10.0 12/04/2013 1429   CALCIUM 10.1 07/28/2013 1059   ALKPHOS 68 12/04/2013 1429   ALKPHOS 66 07/28/2013 1059   AST 21 12/04/2013 1429   AST 24 07/28/2013 1059   ALT 23 12/04/2013 1429   ALT 26 07/28/2013 1059   BILITOT 0.20 12/04/2013 1429   BILITOT 0.5 07/28/2013 1059      STUDIES: Mm Digital Diagnostic Bilat  11/22/2013   CLINICAL DATA:  Patient with history of left breast lumpectomy 2010.  EXAM: DIGITAL DIAGNOSTIC  BILATERAL MAMMOGRAM WITH CAD  ULTRASOUND RIGHT BREAST  COMPARISON:  [facility]  ACR Breast Density Category c: The breast tissue is heterogeneously dense, which may obscure small masses.  FINDINGS: Stable post lumpectomy changes left breast. Questioned asymmetry within the lateral aspect of the right breast resolved with additional imaging, most compatible with overlapping fibroglandular breast tissue. No additional concerning masses, calcifications or nonsurgical architectural distortion identified within either breast.  Mammographic images were processed with CAD.  On physical exam, I palpate no discrete mass within lateral right breast.  Ultrasound is performed, showing no concerning mass within the lateral right breast.  IMPRESSION: Stable postlumpectomy changes.  No mammographic evidence for malignancy.  RECOMMENDATION: Bilateral diagnostic mammography in 1 year.  I have discussed the findings and recommendations with the patient. Results were also provided in writing at the conclusion of the visit. If applicable, a reminder letter will be sent to the patient regarding the next appointment.  BI-RADS CATEGORY  2: Benign.   Electronically Signed   By: Lovey Newcomer M.D.   On: 11/22/2013 11:45   US Breast Ltd Uni Right Inc Axilla  11/22/2013   CLINICAL DATA:  Patient with history of left breast lumpectomy  2010.  EXAM: DIGITAL DIAGNOSTIC  BILATERAL MAMMOGRAM WITH CAD  ULTRASOUND RIGHT BREAST  COMPARISON:   [facility]  ACR Breast Density Category c: The breast tissue is heterogeneously dense, which may obscure small masses.  FINDINGS: Stable post lumpectomy changes left breast. Questioned asymmetry within the lateral aspect of the right breast resolved with additional imaging, most compatible with overlapping fibroglandular breast tissue. No additional concerning masses, calcifications or nonsurgical architectural distortion identified within either breast.  Mammographic images were processed with CAD.  On physical exam, I palpate no discrete mass within lateral right breast.  Ultrasound is performed, showing no concerning mass within the lateral right breast.  IMPRESSION: Stable postlumpectomy changes.  No mammographic evidence for malignancy.  RECOMMENDATION: Bilateral diagnostic mammography in 1 year.  I have discussed the findings and recommendations with the patient. Results were also provided in writing at the conclusion of the visit. If applicable, a reminder letter will be sent to the patient regarding the next appointment.  BI-RADS CATEGORY  2: Benign.   Electronically Signed   By: Lovey Newcomer M.D.   On: 11/22/2013 11:45     ASSESSMENT: 63 y.o. [place] Parkins woman   (1) s/p left lumpectomy and sentinel lymph node dissection October 22, 2009 for a pT2 pN0, stage IIA invasive ductal carcinoma grade 3, estrogen and progesterone receptor negative, HER-2 negative by FISH, with an initially positive margin cleared by additional resection November 19, 2009  (2) completed 4 cycles of doxorubicin/ cyclophosphamide with dose reductions due to neutropenic fever and colitis;  (3) received weekly paclitaxel x 6 with multiple dose reductions and delays due to neuropathy and other symptoms; last chemotherapy dose 05/01/2010  (4) radiation to the left breast completed 08/13/2010  (5) benigh left breast biopsy 11/19/2011  (6) peripheral neuropathy  PLAN: Lacee is doing fine from a breast cancer point of view. She  has significant symmetrical arthritis pain and I offered her referral to a pain clinic, but she "wants to rise above it", and plans to continue to take Aleve intermittently "and that will be that".  From a breast cancer point of view she understands that triple negative breast cancers if they are going to recur tend to recur early. She is happy being "released" from followup at this point. I am not uncomfortable with that.  She understands she will continue to need a yearly mammography and a yearly physician breast exam.  I have not made her any further routine appointments here, but of course I will be glad to see her again at any point in the future if the need arises.  MAGRINAT,GUSTAV C    12/04/2013

## 2013-12-05 ENCOUNTER — Other Ambulatory Visit: Payer: Self-pay | Admitting: Oncology

## 2013-12-05 DIAGNOSIS — Z853 Personal history of malignant neoplasm of breast: Secondary | ICD-10-CM

## 2013-12-14 ENCOUNTER — Other Ambulatory Visit: Payer: Self-pay

## 2013-12-14 DIAGNOSIS — R079 Chest pain, unspecified: Secondary | ICD-10-CM

## 2013-12-14 DIAGNOSIS — R0602 Shortness of breath: Secondary | ICD-10-CM

## 2014-04-06 ENCOUNTER — Ambulatory Visit: Payer: Medicare Other | Admitting: Internal Medicine

## 2014-11-26 ENCOUNTER — Ambulatory Visit
Admission: RE | Admit: 2014-11-26 | Discharge: 2014-11-26 | Disposition: A | Discharge status: Home / Self Care | Payer: Medicare Other | Source: Ambulatory Visit | Attending: Oncology | Admitting: Oncology

## 2014-11-26 ENCOUNTER — Other Ambulatory Visit: Payer: Self-pay | Admitting: Oncology

## 2014-11-26 DIAGNOSIS — N6310 Unspecified lump in the right breast, unspecified quadrant: Secondary | ICD-10-CM

## 2014-11-26 DIAGNOSIS — Z853 Personal history of malignant neoplasm of breast: Secondary | ICD-10-CM

## 2014-12-06 ENCOUNTER — Telehealth: Payer: Self-pay | Admitting: Oncology

## 2014-12-06 NOTE — Telephone Encounter (Signed)
PT CALLED TO REQUEST RECORDS BE SENT TO OFFICE IN [place]. PROVIED EMAIL ADDRESS TO RECEIVE RELEASE OF RECORDS FORM

## 2016-06-23 DEATH — deceased
